# Patient Record
Sex: Male | Born: 1965 | Race: Black or African American | Hispanic: No | Marital: Married | Smoking: Current some day smoker
Health system: Southern US, Community
[De-identification: ages and names within clinical notes are randomized; demographics above are authoritative.]

## PROBLEM LIST (undated history)

## (undated) DIAGNOSIS — E119 Type 2 diabetes mellitus without complications: Secondary | ICD-10-CM

## (undated) DIAGNOSIS — I10 Essential (primary) hypertension: Secondary | ICD-10-CM

## (undated) HISTORY — PX: LEG SURGERY: SHX1003

---

## 2019-04-22 ENCOUNTER — Other Ambulatory Visit: Payer: Self-pay | Admitting: *Deleted

## 2019-04-22 DIAGNOSIS — Z20822 Contact with and (suspected) exposure to covid-19: Secondary | ICD-10-CM

## 2019-04-22 NOTE — Progress Notes (Signed)
78

## 2020-02-20 ENCOUNTER — Encounter (HOSPITAL_COMMUNITY): Payer: Self-pay | Admitting: Emergency Medicine

## 2020-02-20 ENCOUNTER — Emergency Department (HOSPITAL_COMMUNITY)
Admission: EM | Admit: 2020-02-20 | Discharge: 2020-02-20 | Disposition: A | Discharge status: Home / Self Care | Payer: Self-pay | Attending: Emergency Medicine | Admitting: Emergency Medicine

## 2020-02-20 ENCOUNTER — Other Ambulatory Visit: Payer: Self-pay

## 2020-02-20 ENCOUNTER — Emergency Department (HOSPITAL_COMMUNITY): Payer: Self-pay

## 2020-02-20 DIAGNOSIS — I10 Essential (primary) hypertension: Secondary | ICD-10-CM | POA: Insufficient documentation

## 2020-02-20 DIAGNOSIS — X509XXA Other and unspecified overexertion or strenuous movements or postures, initial encounter: Secondary | ICD-10-CM | POA: Insufficient documentation

## 2020-02-20 DIAGNOSIS — Y990 Civilian activity done for income or pay: Secondary | ICD-10-CM | POA: Insufficient documentation

## 2020-02-20 DIAGNOSIS — Y9389 Activity, other specified: Secondary | ICD-10-CM | POA: Insufficient documentation

## 2020-02-20 DIAGNOSIS — Y929 Unspecified place or not applicable: Secondary | ICD-10-CM | POA: Insufficient documentation

## 2020-02-20 DIAGNOSIS — Z87891 Personal history of nicotine dependence: Secondary | ICD-10-CM | POA: Insufficient documentation

## 2020-02-20 DIAGNOSIS — E119 Type 2 diabetes mellitus without complications: Secondary | ICD-10-CM | POA: Insufficient documentation

## 2020-02-20 DIAGNOSIS — S46911A Strain of unspecified muscle, fascia and tendon at shoulder and upper arm level, right arm, initial encounter: Secondary | ICD-10-CM | POA: Insufficient documentation

## 2020-02-20 HISTORY — DX: Essential (primary) hypertension: I10

## 2020-02-20 HISTORY — DX: Type 2 diabetes mellitus without complications: E11.9

## 2020-02-20 NOTE — ED Triage Notes (Signed)
Pt presents with R elbow pain after hearing a pop while strapping material on a pallet at work today. Pain worse with movement.

## 2020-02-20 NOTE — ED Provider Notes (Signed)
[facility] EMERGENCY DEPARTMENT Provider Note   CSN: [id number] Arrival date & time: 02/20/20  1236     History Chief Complaint  Patient presents with  . Elbow Pain    Bobby Bates is a 54 y.o. male w PMHx HTN. DM, presenting to the ED with complaint of sudden onset of right elbow pain that began while at work today.  He states he was strapping material onto a pallet at work and pulling with his right arm when he felt a pop and pain in his right elbow.  Pain is worse with extension of his elbow.  No prior injuries to his elbow.  He took ibuprofen prior to arrival for symptoms.  No other injuries reported.  The history is provided by the patient.       Past Medical History:  Diagnosis Date  . Diabetes mellitus without complication (HCC)   . Hypertension     There are no problems to display for this patient.   History reviewed. No pertinent surgical history.     No family history on file.  Social History   Tobacco Use  . Smoking status: Former Smoker    Packs/day: 0.50    Years: 10.00    Pack years: 5.00    Types: Cigarettes    Quit date: 02/04/2020    Years since quitting: 0.0  . Smokeless tobacco: Never Used  Substance Use Topics  . Alcohol use: Never  . Drug use: Never    Home Medications Prior to Admission medications   Not on File    Allergies    Patient has no known allergies.  Review of Systems   Review of Systems  All other systems reviewed and are negative.   Physical Exam Updated Vital Signs BP 137/86 (BP Location: Right Arm)   Pulse 69   Temp 98.3 F (36.8 C) (Oral)   Resp 16   SpO2 95%   Physical Exam Vitals and nursing note reviewed.  Constitutional:      General: He is not in acute distress.    Appearance: He is well-developed.  HENT:     Head: Normocephalic and atraumatic.  Eyes:     Conjunctiva/sclera: Conjunctivae normal.  Cardiovascular:     Rate and Rhythm: Normal rate and regular rhythm.   Comments: Strong radial pulse. Pulmonary:     Effort: Pulmonary effort is normal.     Breath sounds: Normal breath sounds.  Musculoskeletal:     Comments: Right elbow without obvious swelling or deformity.  There is tenderness along the distal triceps at the insertion into the ulna/olecranon. No palpable bulge. Pt has strong active extension of the elbow against resistance. This does reproduce the pain. Passive flexion of the wrist also causes pain. No other tenderness noted to the elbow. No pain with supination/pronation of the wrist.  Neurological:     Mental Status: He is alert.     Comments: Normal sensation to right hand and digits.  Psychiatric:        Mood and Affect: Mood normal.        Behavior: Behavior normal.     ED Results / Procedures / Treatments   Labs (all labs ordered are listed, but only abnormal results are displayed) Labs Reviewed - No data to display  EKG None  Radiology DG Elbow Complete Right  Result Date: 02/20/2020 CLINICAL DATA:  Right elbow pain after injury today. EXAM: RIGHT ELBOW - COMPLETE 3+ VIEW COMPARISON:  None. FINDINGS: Minimal degenerative change  at the elbow joint. No acute fracture or dislocation. No significant joint effusion. Minimal spurring over the olecranon. IMPRESSION: No acute findings. Electronically Signed   By: Marin Olp M.D.   On: 02/20/2020 15:21    Procedures Procedures (including critical care time)  Medications Ordered in ED Medications - No data to display  ED Course  I have reviewed the triage vital signs and the nursing notes.  Pertinent labs & imaging results that were available during my care of the patient were reviewed by me and considered in my medical decision making (see chart for details).    MDM Rules/Calculators/A&P                      Pt with likely triceps strain after injury that occurred today while at work.  He has strong extension of the elbow against resistance, no palpable bulge.  Triceps  appears to be intact.  X-ray is negative.  Neurovascularly intact.  Elbow wrapped with Ace wrap for compression and support.  Discussed symptomatic management, RICE therapy, OTC medications for pain, and outpatient follow-up as needed.  Work note provided.  Patient is safe for discharge.  Discussed results, findings, treatment and follow up. Patient advised of return precautions. Patient verbalized understanding and agreed with plan.  Final Clinical Impression(s) / ED Diagnoses Final diagnoses:  Strain of right elbow, initial encounter    Rx / DC Orders ED Discharge Orders    None       Saman Umstead, [place] N, PA-C 02/20/20 1629    Maudie Flakes, MD 02/26/20 [phone]

## 2020-02-20 NOTE — Discharge Instructions (Signed)
Please read instructions below. Apply ice to your elbow for 20 minutes at a time. You can take over-the-counter medications as directed for pain. Schedule an appointment with primary care. Return to the ER for new or concerning symptoms.

## 2020-02-20 NOTE — ED Notes (Signed)
Patient Alert and oriented to baseline. Stable and ambulatory to baseline. Patient verbalized understanding of the discharge instructions.  Patient belongings were taken by the patient.   

## 2020-09-02 ENCOUNTER — Ambulatory Visit (HOSPITAL_COMMUNITY)
Admission: EM | Admit: 2020-09-02 | Discharge: 2020-09-02 | Disposition: A | Discharge status: Home / Self Care | Payer: Self-pay

## 2020-09-02 ENCOUNTER — Other Ambulatory Visit: Payer: Self-pay

## 2020-09-02 ENCOUNTER — Encounter (HOSPITAL_COMMUNITY): Payer: Self-pay

## 2020-09-02 ENCOUNTER — Ambulatory Visit (INDEPENDENT_AMBULATORY_CARE_PROVIDER_SITE_OTHER): Payer: Self-pay

## 2020-09-02 DIAGNOSIS — M545 Low back pain, unspecified: Secondary | ICD-10-CM

## 2020-09-02 DIAGNOSIS — W19XXXA Unspecified fall, initial encounter: Secondary | ICD-10-CM

## 2020-09-02 MED ORDER — CYCLOBENZAPRINE HCL 10 MG PO TABS: 10.0000 mg | ORAL_TABLET | Freq: Three times a day (TID) | ORAL | 0 refills | Status: DC | PRN

## 2020-09-02 MED ORDER — NAPROXEN 500 MG PO TABS: 500.0000 mg | ORAL_TABLET | Freq: Two times a day (BID) | ORAL | 0 refills | Status: DC
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
## 2020-09-28 ENCOUNTER — Encounter (HOSPITAL_COMMUNITY): Payer: Self-pay | Admitting: Emergency Medicine

## 2020-09-28 ENCOUNTER — Ambulatory Visit (HOSPITAL_COMMUNITY)
Admission: EM | Admit: 2020-09-28 | Discharge: 2020-09-28 | Disposition: A | Discharge status: Home / Self Care | Payer: Self-pay | Attending: Urgent Care | Admitting: Urgent Care

## 2020-09-28 DIAGNOSIS — R42 Dizziness and giddiness: Secondary | ICD-10-CM

## 2020-09-28 DIAGNOSIS — E1165 Type 2 diabetes mellitus with hyperglycemia: Secondary | ICD-10-CM

## 2020-09-28 DIAGNOSIS — N481 Balanitis: Secondary | ICD-10-CM

## 2020-09-28 DIAGNOSIS — H538 Other visual disturbances: Secondary | ICD-10-CM

## 2020-09-28 DIAGNOSIS — R110 Nausea: Secondary | ICD-10-CM

## 2020-09-28 DIAGNOSIS — I950 Idiopathic hypotension: Secondary | ICD-10-CM

## 2020-09-28 DIAGNOSIS — Z760 Encounter for issue of repeat prescription: Secondary | ICD-10-CM

## 2020-09-28 DIAGNOSIS — I959 Hypotension, unspecified: Secondary | ICD-10-CM

## 2020-09-28 LAB — POCT URINALYSIS DIPSTICK, ED / UC
Bilirubin Urine: NEGATIVE
Glucose, UA: >=1000 mg/dL — AB
Hgb urine dipstick: NEGATIVE
Ketones, ur: NEGATIVE mg/dL
Leukocytes,Ua: NEGATIVE
Nitrite: NEGATIVE
Protein, ur: NEGATIVE mg/dL
Specific Gravity, Urine: 1.01 (ref 1.005–1.030)
Urobilinogen, UA: 0.2 mg/dL (ref 0.0–1.0)
pH: 5 (ref 5.0–8.0)

## 2020-09-28 LAB — CBG MONITORING, ED: Glucose-Capillary: 489 mg/dL — ABNORMAL HIGH (ref 70–99)

## 2020-09-28 MED ORDER — ATORVASTATIN CALCIUM 20 MG PO TABS: 20.0000 mg | ORAL_TABLET | Freq: Every day | ORAL | 0 refills | Status: AC

## 2020-09-28 MED ORDER — METFORMIN HCL 1000 MG PO TABS: 1000.0000 mg | ORAL_TABLET | Freq: Two times a day (BID) | ORAL | 0 refills | Status: DC

## 2020-09-28 MED ORDER — NYSTATIN-TRIAMCINOLONE 100000-0.1 UNIT/GM-% EX CREA: TOPICAL_CREAM | CUTANEOUS | 0 refills | Status: DC

## 2020-09-28 NOTE — ED Triage Notes (Addendum)
Pt presents for medication refill. States recently moved to [facility] and does not have PCP yet. States vision is very blurry. States has been with out medications for 1 month.   Marlowe [place], PA notified about pts CBG

## 2020-09-28 NOTE — ED Provider Notes (Signed)
[facility]  MRN: [id number] DOB: Apr 21, 1966  Subjective:   Bobby Bates is a 55 y.o. male presenting for medication refill. Patient states he has been out of his medication for a month. Was on Metformin. Has actually been using his wife's insulin even though he is not prescribed this. Diet is not compliant. He does not have a PCP as he just moved here from [place]. Has had intermittent blurred vision, stinging penile pain and inability to retract the foreskin. Denies fever, headache, confusion, chest pain, shortness of breath, cough, sore throat, belly pain, hematuria, dysuria, urinary frequency. Regarding his blood pressure, patient takes lisinopril 20 mg once daily. He has been compliant with this.  No current facility-administered medications for this encounter.  Current Outpatient Medications:  .  aspirin EC 81 MG tablet, Take 81 mg by mouth daily. Swallow whole., Disp: , Rfl:  .  atorvastatin (LIPITOR) 20 MG tablet, Take 20 mg by mouth daily., Disp: , Rfl:  .  cyclobenzaprine (FLEXERIL) 10 MG tablet, Take 1 tablet (10 mg total) by mouth 3 (three) times daily as needed for muscle spasms. DO NOT DRINK ALCOHOL OR DRIVE WHILE TAKING THIS MEDICATION, Disp: 30 tablet, Rfl: 0 .  lisinopril (ZESTRIL) 20 MG tablet, Take 20 mg by mouth daily. Pt unsure of dosage strength, Disp: , Rfl:  .  metFORMIN (GLUCOPHAGE) 500 MG tablet, Take 500 mg by mouth 2 (two) times daily with a meal., Disp: , Rfl:  .  naproxen (NAPROSYN) 500 MG tablet, Take 1 tablet (500 mg total) by mouth 2 (two) times daily., Disp: 30 tablet, Rfl: 0   No Known Allergies  Past Medical History:  Diagnosis Date  . Diabetes mellitus without complication (HCC)   . Hypertension      Past Surgical History:  Procedure Laterality Date  . LEG SURGERY Right     Family History  Problem Relation Age of Onset  . Kidney failure Father     Social History   Tobacco Use  . Smoking status: Former Smoker     Packs/day: 0.50    Years: 10.00    Pack years: 5.00    Types: Cigarettes    Quit date: 02/04/2020    Years since quitting: 0.6  . Smokeless tobacco: Never Used  Substance Use Topics  . Alcohol use: Never  . Drug use: Never    ROS   Objective:   Vitals: BP (!) 95/42 (BP Location: Left Arm)   Pulse 96   Temp 98 F (36.7 C) (Oral)   Resp 17   SpO2 97%   Physical Exam Constitutional:      General: He is not in acute distress.    Appearance: Normal appearance. He is well-developed. He is not ill-appearing, toxic-appearing or diaphoretic.  HENT:     Head: Normocephalic and atraumatic.     Right Ear: External ear normal.     Left Ear: External ear normal.     Nose: Nose normal.     Mouth/Throat:     Mouth: Mucous membranes are moist.     Pharynx: Oropharynx is clear.  Eyes:     General: No scleral icterus.    Extraocular Movements: Extraocular movements intact.     Pupils: Pupils are equal, round, and reactive to light.  Cardiovascular:     Rate and Rhythm: Normal rate and regular rhythm.     Heart sounds: Normal heart sounds. No murmur heard. No friction rub. No gallop.   Pulmonary:  Effort: Pulmonary effort is normal. No respiratory distress.     Breath sounds: Normal breath sounds. No stridor. No wheezing, rhonchi or rales.  Genitourinary:    Penis: Uncircumcised. Paraphimosis present.   Neurological:     Mental Status: He is alert and oriented to person, place, and time.     Cranial Nerves: No cranial nerve deficit.     Motor: No weakness.     Coordination: Coordination normal.     Gait: Gait normal.     Deep Tendon Reflexes: Reflexes normal.  Psychiatric:        Mood and Affect: Mood normal.        Behavior: Behavior normal.        Thought Content: Thought content normal.     Results for orders placed or performed during the hospital encounter of 09/28/20 (from the past 24 hour(s))  POC CBG monitoring     Status: Abnormal   Collection Time: 09/28/20  10:08 AM  Result Value Ref Range   Glucose-Capillary 489 (H) 70 - 99 mg/dL  POC Urinalysis dipstick     Status: Abnormal   Collection Time: 09/28/20 10:34 AM  Result Value Ref Range   Glucose, UA >=1000 (A) NEGATIVE mg/dL   Bilirubin Urine NEGATIVE NEGATIVE   Ketones, ur NEGATIVE NEGATIVE mg/dL   Specific Gravity, Urine 1.010 1.005 - 1.030   Hgb urine dipstick NEGATIVE NEGATIVE   pH 5.0 5.0 - 8.0   Protein, ur NEGATIVE NEGATIVE mg/dL   Urobilinogen, UA 0.2 0.0 - 1.0 mg/dL   Nitrite NEGATIVE NEGATIVE   Leukocytes,Ua NEGATIVE NEGATIVE    Assessment and Plan :   PDMP not reviewed this encounter.  1. Encounter for medication refill   2. Hypotension, unspecified hypotension type   3. Uncontrolled type 2 diabetes mellitus with hyperglycemia (HCC)   4. Queasiness   5. Dizziness   6. Blurred vision   7. Balanitis     Emphasized need for significant dietary modifications for better control of his blood sugar. Refilled Metformin and gave patient dosing instructions. Recommended he start Mycolog for balanitis. Take half lisinopril dose as I suspect he is experiencing hypotension from his lisinopril. Recommend he establish care with a new PCP through [facility]. Counseled patient on potential for adverse effects with medications prescribed/recommended today, ER and return-to-clinic precautions discussed, patient verbalized understanding.    Wallis Bamberg, PA-C 09/28/20 1058

## 2020-09-28 NOTE — Discharge Instructions (Addendum)
Metformin Dosing (to be taken with food) Week 1: take 1/2 tablet twice a day. Week 2: take 1 tablet in the morning, 1/2 tablet at night. Week 3: take 1 tablet twice a day.  Start taking only half tablet of lisinopril daily for your blood pressure.  For diabetes or elevated blood sugar, please make sure you are limiting and avoiding starchy, carbohydrate foods like pasta, breads, sweet breads, pastry, rice, potatoes, desserts. These foods can elevate your blood sugar. Also, limit and avoid drinks that contain a lot of sugar such as sodas, sweet teas, fruit juices.  Drinking plain water will be much more helpful, try 64 ounces of water daily.  It is okay to flavor your water naturally by cutting cucumber, lemon, mint or lime, placing it in a picture with water and drinking it over a period of 24-48 hours as long as it remains refrigerated.  For elevated blood pressure, make sure you are monitoring salt in your diet.  Do not eat restaurant foods and limit processed foods at home. I highly recommend you prepare and cook your own foods at home.  Processed foods include things like frozen meals, pre-seasoned meats and dinners, deli meats, canned foods as these foods contain a high amount of sodium/salt.  Make sure you are paying attention to sodium labels on foods you buy at the grocery store. Buy your spices separately such as garlic powder, onion powder, cumin, cayenne, parsley flakes so that you can avoid seasonings that contain salt. However, salt-free seasonings are available and can be used, an example is Mrs. Dash and includes a lot of different mixtures that do not contain salt.  Lastly, when cooking using oils that are healthier for you is important. This includes olive oil, avocado oil, canola oil. We have discussed a lot of foods to avoid but below is a list of foods that can be very healthy to use in your diet whether it is for diabetes, cholesterol, high blood pressure, or in general healthy  eating.  Salads - kale, spinach, cabbage, spring mix, arugula Fruits - avocadoes, berries (blueberries, raspberries, blackberries), apples, oranges, pomegranate, grapefruit, kiwi Vegetables - asparagus, cauliflower, broccoli, green beans, brussel sprouts, bell peppers, beets; stay away from or limit starchy vegetables like potatoes, carrots, peas Other general foods - kidney beans, egg whites, almonds, walnuts, sunflower seeds, pumpkin seeds, fat free yogurt, almond milk, flax seeds, quinoa, oats  Meat - It is better to eat lean meats and limit your red meat including pork to once a week.  Wild caught fish, chicken breast are good options as they tend to be leaner sources of good protein. Still be mindful of the sodium labels for the meats you buy.  DO NOT EAT ANY FOODS ON THIS LIST THAT YOU ARE ALLERGIC TO. For more specific needs, I highly recommend consulting a dietician or nutritionist but this can definitely be a good starting point.

## 2020-10-12 ENCOUNTER — Emergency Department (HOSPITAL_COMMUNITY)
Admission: EM | Admit: 2020-10-12 | Discharge: 2020-10-12 | Disposition: A | Discharge status: Home / Self Care | Payer: Medicaid Other | Attending: Emergency Medicine | Admitting: Emergency Medicine

## 2020-10-12 ENCOUNTER — Other Ambulatory Visit: Payer: Self-pay

## 2020-10-12 ENCOUNTER — Encounter (HOSPITAL_COMMUNITY): Payer: Self-pay | Admitting: Emergency Medicine

## 2020-10-12 DIAGNOSIS — Z5321 Procedure and treatment not carried out due to patient leaving prior to being seen by health care provider: Secondary | ICD-10-CM | POA: Insufficient documentation

## 2020-10-12 DIAGNOSIS — R369 Urethral discharge, unspecified: Secondary | ICD-10-CM | POA: Insufficient documentation

## 2020-10-12 NOTE — ED Triage Notes (Signed)
Patient is complaining of a penile discharge that started about 4 days ago.

## 2020-10-12 NOTE — ED Notes (Signed)
Pt was seen getting into transportation and leaving by [facility].

## 2020-10-27 ENCOUNTER — Inpatient Hospital Stay (HOSPITAL_COMMUNITY)
Admission: EM | Admit: 2020-10-27 | Discharge: 2020-10-28 | DRG: 638 | Disposition: A | Discharge status: Home / Self Care | Payer: Medicaid Other | Attending: Internal Medicine | Admitting: Internal Medicine

## 2020-10-27 ENCOUNTER — Other Ambulatory Visit: Payer: Self-pay

## 2020-10-27 ENCOUNTER — Encounter (HOSPITAL_COMMUNITY): Payer: Self-pay | Admitting: Emergency Medicine

## 2020-10-27 DIAGNOSIS — N481 Balanitis: Secondary | ICD-10-CM

## 2020-10-27 DIAGNOSIS — Z79899 Other long term (current) drug therapy: Secondary | ICD-10-CM

## 2020-10-27 DIAGNOSIS — N390 Urinary tract infection, site not specified: Secondary | ICD-10-CM | POA: Diagnosis present

## 2020-10-27 DIAGNOSIS — N179 Acute kidney failure, unspecified: Secondary | ICD-10-CM | POA: Diagnosis present

## 2020-10-27 DIAGNOSIS — E860 Dehydration: Secondary | ICD-10-CM

## 2020-10-27 DIAGNOSIS — E861 Hypovolemia: Secondary | ICD-10-CM | POA: Diagnosis present

## 2020-10-27 DIAGNOSIS — Z7984 Long term (current) use of oral hypoglycemic drugs: Secondary | ICD-10-CM

## 2020-10-27 DIAGNOSIS — E871 Hypo-osmolality and hyponatremia: Secondary | ICD-10-CM | POA: Diagnosis present

## 2020-10-27 DIAGNOSIS — Z597 Insufficient social insurance and welfare support: Secondary | ICD-10-CM

## 2020-10-27 DIAGNOSIS — E1129 Type 2 diabetes mellitus with other diabetic kidney complication: Secondary | ICD-10-CM | POA: Diagnosis present

## 2020-10-27 DIAGNOSIS — Z841 Family history of disorders of kidney and ureter: Secondary | ICD-10-CM

## 2020-10-27 DIAGNOSIS — Z20822 Contact with and (suspected) exposure to covid-19: Secondary | ICD-10-CM | POA: Diagnosis present

## 2020-10-27 DIAGNOSIS — R739 Hyperglycemia, unspecified: Secondary | ICD-10-CM

## 2020-10-27 DIAGNOSIS — I10 Essential (primary) hypertension: Secondary | ICD-10-CM

## 2020-10-27 DIAGNOSIS — Z87891 Personal history of nicotine dependence: Secondary | ICD-10-CM

## 2020-10-27 DIAGNOSIS — E1165 Type 2 diabetes mellitus with hyperglycemia: Principal | ICD-10-CM | POA: Diagnosis present

## 2020-10-27 DIAGNOSIS — Z7982 Long term (current) use of aspirin: Secondary | ICD-10-CM

## 2020-10-27 LAB — HEPATIC FUNCTION PANEL
ALT: 33 U/L (ref 0–44)
AST: 22 U/L (ref 15–41)
Albumin: 4.1 g/dL (ref 3.5–5.0)
Alkaline Phosphatase: 83 U/L (ref 38–126)
Bilirubin, Direct: 0.2 mg/dL (ref 0.0–0.2)
Indirect Bilirubin: 1.2 mg/dL — ABNORMAL HIGH (ref 0.3–0.9)
Total Bilirubin: 1.4 mg/dL — ABNORMAL HIGH (ref 0.3–1.2)
Total Protein: 7.9 g/dL (ref 6.5–8.1)

## 2020-10-27 LAB — URINALYSIS, ROUTINE W REFLEX MICROSCOPIC
Bilirubin Urine: NEGATIVE
Glucose, UA: >=500 mg/dL — AB
Hgb urine dipstick: NEGATIVE
Ketones, ur: NEGATIVE mg/dL
Nitrite: NEGATIVE
Protein, ur: NEGATIVE mg/dL
Specific Gravity, Urine: 1.02 (ref 1.005–1.030)
pH: 5 (ref 5.0–8.0)

## 2020-10-27 LAB — BASIC METABOLIC PANEL
Anion gap: 11 (ref 5–15)
BUN: 39 mg/dL — ABNORMAL HIGH (ref 6–20)
CO2: 23 mmol/L (ref 22–32)
Calcium: 9.4 mg/dL (ref 8.9–10.3)
Chloride: 93 mmol/L — ABNORMAL LOW (ref 98–111)
Creatinine, Ser: 2.17 mg/dL — ABNORMAL HIGH (ref 0.61–1.24)
GFR, Estimated: 35 mL/min — ABNORMAL LOW (ref >60)
Glucose, Bld: 558 mg/dL (ref 70–99)
Potassium: 4.2 mmol/L (ref 3.5–5.1)
Sodium: 127 mmol/L — ABNORMAL LOW (ref 135–145)

## 2020-10-27 LAB — URINALYSIS, MICROSCOPIC (REFLEX): WBC, UA: >50 WBC/hpf (ref 0–5)

## 2020-10-27 LAB — CBC
HCT: 39.5 % (ref 39.0–52.0)
Hemoglobin: 13 g/dL (ref 13.0–17.0)
MCH: 31.2 pg (ref 26.0–34.0)
MCHC: 32.9 g/dL (ref 30.0–36.0)
MCV: 94.7 fL (ref 80.0–100.0)
Platelets: 209 10*3/uL (ref 150–400)
RBC: 4.17 MIL/uL — ABNORMAL LOW (ref 4.22–5.81)
RDW: 11.5 % (ref 11.5–15.5)
WBC: 9.3 10*3/uL (ref 4.0–10.5)
nRBC: 0 % (ref 0.0–0.2)

## 2020-10-27 LAB — LIPASE, BLOOD: Lipase: 27 U/L (ref 11–51)

## 2020-10-27 LAB — CBG MONITORING, ED
Glucose-Capillary: 334 mg/dL — ABNORMAL HIGH (ref 70–99)
Glucose-Capillary: 287 mg/dL — ABNORMAL HIGH (ref 70–99)

## 2020-10-27 LAB — HEMOGLOBIN A1C
Hgb A1c MFr Bld: 12 % — ABNORMAL HIGH (ref 4.8–5.6)
Mean Plasma Glucose: 297.7 mg/dL

## 2020-10-27 LAB — HIV ANTIBODY (ROUTINE TESTING W REFLEX): HIV Screen 4th Generation wRfx: NONREACTIVE

## 2020-10-27 LAB — SARS CORONAVIRUS 2 (TAT 6-24 HRS): SARS Coronavirus 2: NEGATIVE

## 2020-10-27 MED ORDER — SODIUM CHLORIDE 0.9 % IV SOLN: INTRAVENOUS | Status: AC

## 2020-10-27 MED ORDER — ACETAMINOPHEN 650 MG RE SUPP: 650.0000 mg | Freq: Four times a day (QID) | RECTAL | Status: DC | PRN

## 2020-10-27 MED ORDER — SODIUM CHLORIDE 0.9 % IV SOLN
1.0000 g | Freq: Once | INTRAVENOUS | Status: DC
  Administered 2020-10-27: 1 g via INTRAVENOUS
  Filled 2020-10-27: qty 10

## 2020-10-27 MED ORDER — DEXTROSE 50 % IV SOLN: 0.0000 mL | INTRAVENOUS | Status: DC | PRN

## 2020-10-27 MED ORDER — INSULIN ASPART 100 UNIT/ML ~~LOC~~ SOLN
0.0000 [IU] | Freq: Every day | SUBCUTANEOUS | Status: DC
  Administered 2020-10-27: 3 [IU] via SUBCUTANEOUS

## 2020-10-27 MED ORDER — SODIUM CHLORIDE 0.9 % IV SOLN: INTRAVENOUS | Status: DC

## 2020-10-27 MED ORDER — INSULIN ASPART 100 UNIT/ML ~~LOC~~ SOLN
5.0000 [IU] | Freq: Three times a day (TID) | SUBCUTANEOUS | Status: DC
  Administered 2020-10-28 (×2): 5 [IU] via SUBCUTANEOUS

## 2020-10-27 MED ORDER — INSULIN REGULAR(HUMAN) IN NACL 100-0.9 UT/100ML-% IV SOLN: INTRAVENOUS | Status: DC

## 2020-10-27 MED ORDER — ACETAMINOPHEN 325 MG PO TABS: 650.0000 mg | ORAL_TABLET | Freq: Four times a day (QID) | ORAL | Status: DC | PRN

## 2020-10-27 MED ORDER — NYSTATIN-TRIAMCINOLONE 100000-0.1 UNIT/GM-% EX CREA
TOPICAL_CREAM | Freq: Two times a day (BID) | CUTANEOUS | Status: DC
  Filled 2020-10-27: qty 15

## 2020-10-27 MED ORDER — DEXTROSE IN LACTATED RINGERS 5 % IV SOLN: INTRAVENOUS | Status: DC

## 2020-10-27 MED ORDER — SODIUM CHLORIDE 0.9 % IV SOLN: 1.0000 g | INTRAVENOUS | Status: DC

## 2020-10-27 MED ORDER — SODIUM CHLORIDE 0.9 % IV BOLUS
1000.0000 mL | Freq: Once | INTRAVENOUS | Status: AC
  Administered 2020-10-27: 1000 mL via INTRAVENOUS

## 2020-10-27 MED ORDER — HYDRALAZINE HCL 20 MG/ML IJ SOLN: 5.0000 mg | INTRAMUSCULAR | Status: DC | PRN

## 2020-10-27 MED ORDER — INSULIN ASPART 100 UNIT/ML ~~LOC~~ SOLN
0.0000 [IU] | Freq: Three times a day (TID) | SUBCUTANEOUS | Status: DC
  Administered 2020-10-28 (×2): 2 [IU] via SUBCUTANEOUS

## 2020-10-27 MED ORDER — INSULIN GLARGINE 100 UNIT/ML ~~LOC~~ SOLN
20.0000 [IU] | Freq: Every day | SUBCUTANEOUS | Status: DC
  Administered 2020-10-27: 20 [IU] via SUBCUTANEOUS
  Filled 2020-10-27 (×2): qty 0.2

## 2020-10-27 MED ORDER — ENOXAPARIN SODIUM 40 MG/0.4ML ~~LOC~~ SOLN
40.0000 mg | SUBCUTANEOUS | Status: DC
  Administered 2020-10-27: 40 mg via SUBCUTANEOUS
  Filled 2020-10-27: qty 0.4

## 2020-10-27 NOTE — H&P (Signed)
History and Physical    Bobby Bates PPI:[id number] DOB: 08/15/1966 DOA: 10/27/2020  PCP: Patient, No Pcp Per  Patient coming from: Home  Chief Complaint: Weakness  HPI: Bobby Bates is a 55 y.o. male with medical history significant of DM2, HTN who presents from work with increased weakness. Reports weakness several weeks prior to visit, attributed to working 8hrs. Symptoms later progressed to dizziness several days prior to this visit. On the morning of visit while getting ready to go to work, pt reported more dizziness with blurred vision. Pt subsequently presented to ED for work up.   Of note, pt reports compliance with meds. [place] labs available in this facility. Denies chest pain or sob. Also reports last a1c from 59yrs ago noted to be 5.1  ED Course: In the ED, pt was found to have glucose of 558 with Cr of 2.17. Repeat glucose was noted to be in the mid-300's. Bicarb noted to be 23 with normal anion gap. Pt was given 5 units IV insulin in ED and started on IVF hydration. Pt was also found to have UA suggestive of UTI. Pt was started on empiric rocephin. Hospitalist consulted for consideration for admission  Review of Systems:  Review of Systems  Constitutional: Positive for malaise/fatigue. Negative for chills and fever.  HENT: Negative for congestion, ear discharge and ear pain.   Eyes: Positive for blurred vision. Negative for double vision, pain and discharge.  Respiratory: Negative for hemoptysis, sputum production and shortness of breath.   Cardiovascular: Negative for palpitations, orthopnea and claudication.  Gastrointestinal: Negative for abdominal pain, constipation and vomiting.  Genitourinary: Negative for frequency and urgency.  Musculoskeletal: Negative for back pain, falls and neck pain.  Neurological: Negative for tremors, sensory change, seizures and loss of consciousness.  Psychiatric/Behavioral: Negative for hallucinations and memory  loss. The patient is not nervous/anxious.     Past Medical History:  Diagnosis Date  . Diabetes mellitus without complication (HCC)   . Hypertension     Past Surgical History:  Procedure Laterality Date  . LEG SURGERY Right      reports that he quit smoking about 8 months ago. His smoking use included cigarettes. He has a 5.00 pack-year smoking history. He has never used smokeless tobacco. He reports that he does not drink alcohol and does not use drugs.  No Known Allergies  Family History  Problem Relation Age of Onset  . Kidney failure Father     Prior to Admission medications   Medication Sig Start Date End Date Taking? Authorizing Provider  aspirin EC 81 MG tablet Take 81 mg by mouth daily. Swallow whole.    [provider]  atorvastatin (LIPITOR) 20 MG tablet Take 1 tablet (20 mg total) by mouth daily. 09/28/20   Wallis Bamberg, PA-C  cyclobenzaprine (FLEXERIL) 10 MG tablet Take 1 tablet (10 mg total) by mouth 3 (three) times daily as needed for muscle spasms. DO NOT DRINK ALCOHOL OR DRIVE WHILE TAKING THIS MEDICATION 09/02/20   Particia Nearing, PA-C  lisinopril (ZESTRIL) 20 MG tablet Take 20 mg by mouth daily. Pt unsure of dosage strength    [provider]  metFORMIN (GLUCOPHAGE) 1000 MG tablet Take 1 tablet (1,000 mg total) by mouth 2 (two) times daily with a meal. 09/28/20   Wallis Bamberg, PA-C  naproxen (NAPROSYN) 500 MG tablet Take 1 tablet (500 mg total) by mouth 2 (two) times daily. 09/02/20   Particia Nearing, PA-C  nystatin-triamcinolone (MYCOLOG II) cream Apply to affected area twice daily. 09/28/20   Wallis Bamberg, PA-C    Physical Exam: Vitals:   10/27/20 1219 10/27/20 1545 10/27/20 1615 10/27/20 1715  BP: 100/60 105/69 106/72 123/77  Pulse: 76 (!) 58 62 60  Resp: 18 14 15 15   Temp: 98.2 F (36.8 C)     TempSrc: Oral     SpO2: 98% 96% 98% 97%  Weight: 113.4 kg     Height: 6\' 1"  (1.854 m)        Vitals:   10/27/20 1219 10/27/20 1545  10/27/20 1615 10/27/20 1715  BP: 100/60 105/69 106/72 123/77  Pulse: 76 (!) 58 62 60  Resp: 18 14 15 15   Temp: 98.2 F (36.8 C)     TempSrc: Oral     SpO2: 98% 96% 98% 97%  Weight: 113.4 kg     Height: 6\' 1"  (1.854 m)      General exam: Awake, laying in bed, in nad Respiratory system: Normal respiratory effort, no audible wheezing Cardiovascular system: regular rate, s1, s2 Gastrointestinal system: Soft, nondistended, positive BS Central nervous system: CN2-12 grossly intact, strength intact Extremities: Perfused, no clubbing Skin: Normal skin turgor, no notable skin lesions seen Psychiatry: Mood normal // no visual hallucinations    Labs on Admission: I have personally reviewed following labs and imaging studies  CBC: Recent Labs  Lab 10/27/20 1242  WBC 9.3  HGB 13.0  HCT 39.5  MCV 94.7  PLT 209   Basic Metabolic Panel: Recent Labs  Lab 10/27/20 1242  NA 127*  K 4.2  CL 93*  CO2 23  GLUCOSE 558*  BUN 39*  CREATININE 2.17*  CALCIUM 9.4   GFR: Estimated Creatinine Clearance: 50.2 mL/min (A) (by C-G formula based on SCr of 2.17 mg/dL (H)). Liver Function Tests: No results for input(s): AST, ALT, ALKPHOS, BILITOT, PROT, ALBUMIN in the last 168 hours. No results for input(s): LIPASE, AMYLASE in the last 168 hours. No results for input(s): AMMONIA in the last 168 hours. Coagulation Profile: No results for input(s): INR, PROTIME in the last 168 hours. Cardiac Enzymes: No results for input(s): CKTOTAL, CKMB, CKMBINDEX, TROPONINI in the last 168 hours. BNP (last 3 results) No results for input(s): PROBNP in the last 8760 hours. HbA1C: No results for input(s): HGBA1C in the last 72 hours. CBG: Recent Labs  Lab 10/27/20 1716  GLUCAP 334*   Lipid Profile: No results for input(s): CHOL, HDL, LDLCALC, TRIG, CHOLHDL, LDLDIRECT in the last 72 hours. Thyroid Function Tests: No results for input(s): TSH, T4TOTAL, FREET4, T3FREE, THYROIDAB in the last 72  hours. Anemia Panel: No results for input(s): VITAMINB12, FOLATE, FERRITIN, TIBC, IRON, RETICCTPCT in the last 72 hours. Urine analysis:    Component Value Date/Time   COLORURINE YELLOW 10/27/2020 1326   APPEARANCEUR CLEAR 10/27/2020 1326   LABSPEC 1.020 10/27/2020 1326   PHURINE 5.0 10/27/2020 1326   GLUCOSEU >=500 (A) 10/27/2020 1326   HGBUR NEGATIVE 10/27/2020 1326   BILIRUBINUR NEGATIVE 10/27/2020 1326   KETONESUR NEGATIVE 10/27/2020 1326   PROTEINUR NEGATIVE 10/27/2020 1326   UROBILINOGEN 0.2 09/28/2020 1034   NITRITE NEGATIVE 10/27/2020 1326   LEUKOCYTESUR SMALL (A) 10/27/2020 1326   Sepsis Labs: !!!!!!!!!!!!!!!!!!!!!!!!!!!!!!!!!!!!!!!!!!!! @LABRCNTIP (procalcitonin:4,lacticidven:4) )No results found for this or any previous visit (from the past 240 hour(s)).   Radiological Exams on Admission: No results found.  EKG: Independently reviewed. 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 DM2 1. Poorly controlled with presenting glucose in excess of 500's 2. On metformin prior to admit 3. Pt has since received 5 untis IV insulin in ED 4. Will check a1c 5. Will continue on SSI coverage 6. Will consult diabetic coordinator 7. Will cont pt on 20 units lantus with 5 units meal coverage. Titrate insulin accordingly 8. Will check lipid panel 2. HTN 1. BP stable and controlled at this time 2. On lisinopril prior to admit, will hold given below ARF 3. Cont to monitor for now 3. UTI 1. Presenting UA suggestive of UTI 2. Urine cx pending 3. Continue on empiric rocephin 4. Dehydration 1. Dry membranes on admit, likely secondary to uncontrolled glucose 2. Cont IVF hydration as tolerated 3. Repeat bmet in AM 5. Hyponatremia 1. Cont IVF hydration 2. Repeat bmet in AM 6. ARF 1. Presenting Cr 2.17, unknown baseline 2. Hold  home ACEI 3. Cont on IVF as tolerated 4. Repeat bmet in AM  DVT prophylaxis: Lovenox subq  Code Status: Full Family Communication: pt in room, family not at bedside  Disposition Plan:   Consults called:  Admission status: Inpatient as pt is clinically dehydrated with ARF and will need greater than 2 midnight stay for IVF hydration   Rickey Barbara MD [facility] Pager On Amion  If 7PM-7AM, please contact night-coverage  10/27/2020, 5:58 PM

## 2020-10-27 NOTE — ED Triage Notes (Signed)
Pt arrives to ED with concerns over increased weakness and fatigue over the last month. Pt states that he is unable to finish hs work weeks or 12hr shift due to fatigue. States occasional dizziness. Works at [employer].

## 2020-10-27 NOTE — ED Provider Notes (Signed)
[facility] EMERGENCY DEPARTMENT Provider Note   CSN: [id number] Arrival date & time: 10/27/20  1158     History Chief Complaint  Patient presents with  . Weakness    Bobby Bates is a 55 y.o. male.  Bobby Bates is a 55 y.o. male with hx of HTN, and diabetes, who presents who presented to ED today for weakness. States that for the past month he has had muscle weakness, dizzy spells and headaches. States that he works 12 hour shifts and has been leaving after 8 hours due to dizziness and nausea. States that yesterday he began having nausea, left work and vomited once. He woke up this morning with lightheadedness and presented to ED. BG today was >500. States that he checks his BG at home and was under control around 130s until December when they have been uncontrollable. In addition to hyperglycemia, he has been treating balanitis since December with anti-fungal cream without success. States that he is unable to completely retract foreskin. Also notes that vision has been intermittently blurry, and has had increasing nocturia. Of note, he moved from [place] two years ago and has not established primary. He states he has been using Metformin 500mg  BID as prescribed by previous provider. Denies abdominal pain, nausea. Denies CP or SOB. Denies burning on urination. Denies history of kidney problems.         Past Medical History:  Diagnosis Date  . Diabetes mellitus without complication (HCC)   . Hypertension     There are no problems to display for this patient.   Past Surgical History:  Procedure Laterality Date  . LEG SURGERY Right        Family History  Problem Relation Age of Onset  . Kidney failure Father     Social History   Tobacco Use  . Smoking status: Former Smoker    Packs/day: 0.50    Years: 10.00    Pack years: 5.00    Types: Cigarettes    Quit date: 02/04/2020    Years since quitting: 0.7  . Smokeless tobacco: Never Used  Substance  Use Topics  . Alcohol use: Never  . Drug use: Never    Home Medications Prior to Admission medications   Medication Sig Start Date End Date Taking? Authorizing Provider  aspirin EC 81 MG tablet Take 81 mg by mouth daily. Swallow whole.    [provider]  atorvastatin (LIPITOR) 20 MG tablet Take 1 tablet (20 mg total) by mouth daily. 09/28/20   11/26/20, PA-C  cyclobenzaprine (FLEXERIL) 10 MG tablet Take 1 tablet (10 mg total) by mouth 3 (three) times daily as needed for muscle spasms. DO NOT DRINK ALCOHOL OR DRIVE WHILE TAKING THIS MEDICATION 09/02/20   14/8/21, PA-C  lisinopril (ZESTRIL) 20 MG tablet Take 20 mg by mouth daily. Pt unsure of dosage strength    [provider]  metFORMIN (GLUCOPHAGE) 1000 MG tablet Take 1 tablet (1,000 mg total) by mouth 2 (two) times daily with a meal. 09/28/20   11/26/20, PA-C  naproxen (NAPROSYN) 500 MG tablet Take 1 tablet (500 mg total) by mouth 2 (two) times daily. 09/02/20   14/8/21, PA-C  nystatin-triamcinolone (MYCOLOG II) cream Apply to affected area twice daily. 09/28/20   11/26/20, PA-C    Allergies    Patient has no known allergies.  Review of Systems   Review of Systems  Constitutional: Positive for fatigue. Negative for chills and fever.  HENT:  Negative.   Eyes: Positive for visual disturbance.  Respiratory: Negative for cough and shortness of breath.   Cardiovascular: Negative for chest pain.  Gastrointestinal: Positive for nausea and vomiting. Negative for abdominal pain and diarrhea.  Genitourinary: Positive for dysuria, frequency and penile pain. Negative for scrotal swelling and testicular pain.  Musculoskeletal: Positive for myalgias. Negative for arthralgias.  Skin: Negative for color change and rash.  Neurological: Positive for dizziness and light-headedness. Negative for syncope, weakness and numbness.  All other systems reviewed and are negative.   Physical Exam Updated Vital  Signs BP 100/60   Pulse 76   Temp 98.2 F (36.8 C) (Oral)   Resp 18   Ht 6\' 1"  (1.854 m)   Wt 113.4 kg   SpO2 98%   BMI 32.98 kg/m   Physical Exam Vitals and nursing note reviewed. Exam conducted with a chaperone present.  Constitutional:      General: He is not in acute distress.    Appearance: Normal appearance. He is well-developed and well-nourished. He is not ill-appearing or diaphoretic.     Comments: Well-appearing and in no distress   HENT:     Head: Normocephalic and atraumatic.     Mouth/Throat:     Mouth: Oropharynx is clear and moist. Mucous membranes are dry.     Pharynx: Oropharynx is clear.  Eyes:     General:        Right eye: No discharge.        Left eye: No discharge.     Extraocular Movements: EOM normal.  Cardiovascular:     Rate and Rhythm: Normal rate and regular rhythm.     Pulses: Intact distal pulses.     Heart sounds: Normal heart sounds. No murmur heard. No friction rub. No gallop.   Pulmonary:     Effort: Pulmonary effort is normal. No respiratory distress.     Breath sounds: Normal breath sounds. No wheezing or rales.     Comments: Respirations equal and unlabored, patient able to speak in full sentences, lungs clear to auscultation bilaterally Abdominal:     General: Bowel sounds are normal. There is no distension.     Palpations: Abdomen is soft. There is no mass.     Tenderness: There is no abdominal tenderness. There is no guarding.     Comments: Abdomen soft, nondistended, nontender to palpation in all quadrants without guarding or peritoneal signs, no CVA tenderness  Genitourinary:    Penis: Phimosis, erythema, discharge and swelling present.      Comments: Uncircumcised penis with erythema and swelling over the foreskin, only able to retract minimally with purulent drainage noted, no erythema, swelling or tenderness over the testicles or scrotum. Musculoskeletal:        General: No deformity or edema.     Cervical back: Neck supple.      Right lower leg: No edema.     Left lower leg: No edema.  Skin:    General: Skin is warm and dry.     Capillary Refill: Capillary refill takes less than 2 seconds.  Neurological:     Mental Status: He is alert and oriented to person, place, and time.     Coordination: Coordination normal.     Comments: Speech is clear, able to follow commands Moves extremities without ataxia, coordination intact  Psychiatric:        Mood and Affect: Mood normal.        Behavior: Behavior normal.     ED  Results / Procedures / Treatments   Labs (all labs ordered are listed, but only abnormal results are displayed) Labs Reviewed  BASIC METABOLIC PANEL - Abnormal; Notable for the following components:      Result Value   Sodium 127 (*)    Chloride 93 (*)    Glucose, Bld 558 (*)    BUN 39 (*)    Creatinine, Ser 2.17 (*)    GFR, Estimated 35 (*)    All other components within normal limits  CBC - Abnormal; Notable for the following components:   RBC 4.17 (*)    All other components within normal limits  URINALYSIS, ROUTINE W REFLEX MICROSCOPIC - Abnormal; Notable for the following components:   Glucose, UA >=500 (*)    Leukocytes,Ua SMALL (*)    All other components within normal limits  URINALYSIS, MICROSCOPIC (REFLEX) - Abnormal; Notable for the following components:   Bacteria, UA MANY (*)    All other components within normal limits  CBG MONITORING, ED - Abnormal; Notable for the following components:   Glucose-Capillary 334 (*)    All other components within normal limits  URINE CULTURE  SARS CORONAVIRUS 2 (TAT 6-24 HRS)  LIPASE, BLOOD  HEPATIC FUNCTION PANEL    EKG EKG Interpretation  Date/Time:  Tuesday October 27 2020 17:12:18 EST Ventricular Rate:  63 PR Interval:    QRS Duration: 101 QT Interval:  383 QTC Calculation: 392 R Axis:   60 Text Interpretation: Sinus rhythm Confirmed by Kristine Royal [phone]) on 10/27/2020 5:15:14 PM   Radiology No results  found.  Procedures Procedures   Medications Ordered in ED Medications  cefTRIAXone (ROCEPHIN) 1 g in sodium chloride 0.9 % 100 mL IVPB (has no administration in time range)  nystatin-triamcinolone ointment (MYCOLOG) (has no administration in time range)  dextrose 50 % solution 0-50 mL (has no administration in time range)  0.9 %  sodium chloride infusion (has no administration in time range)  sodium chloride 0.9 % bolus 1,000 mL (1,000 mLs Intravenous New Bag/Given 10/27/20 1725)    ED Course  I have reviewed the triage vital signs and the nursing notes.  Pertinent labs & imaging results that were available during my care of the patient were reviewed by me and considered in my medical decision making (see chart for details).    MDM Rules/Calculators/A&P                         55 year old male with history of diabetes with significant hyperglycemia over the past 4 weeks despite taking his Metformin, has been feeling fatigued with some nausea, body aches and general weakness, no fevers, no abdominal pain, no chest pain or shortness of breath, no syncope.  Has also been dealing with balanitis for a month and has been using antifungal cream without any improvement.  On arrival patient was CBG of 558.  His hyperglycemia likely explains all of his symptoms and is also why his balanitis is likely not improving.  On exam patient with phimosis, only able to retract foreskin minimally with purulent drainage present.  Patient reports he is still able to urinate but has some burning and discomfort with urination.  Lab work initiated from triage with no leukocytosis and normal hemoglobin.  Glucose of 558 with sodium of 127, no other significant electrolyte derangements, but creatinine of 2.17 with BUN of 39, no prior renal function available for comparison but patient reports he has never been told previously that he  has had any problems with his kidney function, and I suspect this is acute in the setting  of hyperglycemia and dehydration.  Patient given IV fluids.  Urinalysis with small leukocytes present, but many bacteria and many WBCs present, likely in the setting of balanitis, but this could be causing superimposed UTI.  Will send urine culture will continue mycylog ointment and start patient on Rocephin.  Fortunately patient is not in DKA, but with hyperglycemia he has become dehydrated with likely AKI, and given IV fluids here in the ED, initially had plan to start patient on glucose stabilizer insulin protocol, but on recheck blood sugar of 334, so we will hold off and just give small bolus dose of IV insulin.  Patient has no PCP for close outpatient follow-up given laboratory derangements feel he would benefit from inpatient admission to ensure better glucose control and improvement in kidney function, feel this will also significantly improved balanitis.  Case discussed with Dr. Ralene Ok with [facility] who will see and admit the patient.  Final Clinical Impression(s) / ED Diagnoses Final diagnoses:  AKI (acute kidney injury) (HCC)  Hyperglycemia  Balanitis    Rx / DC Orders ED Discharge Orders    None       Legrand Rams 10/27/20 2335    Wynetta Fines, MD 10/28/20 2259

## 2020-10-28 ENCOUNTER — Other Ambulatory Visit (HOSPITAL_COMMUNITY): Payer: Self-pay | Admitting: Family Medicine

## 2020-10-28 DIAGNOSIS — N179 Acute kidney failure, unspecified: Secondary | ICD-10-CM

## 2020-10-28 LAB — COMPREHENSIVE METABOLIC PANEL
ALT: 29 U/L (ref 0–44)
AST: 19 U/L (ref 15–41)
Albumin: 3.4 g/dL — ABNORMAL LOW (ref 3.5–5.0)
Alkaline Phosphatase: 72 U/L (ref 38–126)
Anion gap: 11 (ref 5–15)
BUN: 31 mg/dL — ABNORMAL HIGH (ref 6–20)
CO2: 23 mmol/L (ref 22–32)
Calcium: 9.3 mg/dL (ref 8.9–10.3)
Chloride: 100 mmol/L (ref 98–111)
Creatinine, Ser: 1.26 mg/dL — ABNORMAL HIGH (ref 0.61–1.24)
GFR, Estimated: >60 mL/min (ref >60)
Glucose, Bld: 144 mg/dL — ABNORMAL HIGH (ref 70–99)
Potassium: 4 mmol/L (ref 3.5–5.1)
Sodium: 134 mmol/L — ABNORMAL LOW (ref 135–145)
Total Bilirubin: 1.3 mg/dL — ABNORMAL HIGH (ref 0.3–1.2)
Total Protein: 6.7 g/dL (ref 6.5–8.1)

## 2020-10-28 LAB — CBC
HCT: 39.2 % (ref 39.0–52.0)
Hemoglobin: 12.7 g/dL — ABNORMAL LOW (ref 13.0–17.0)
MCH: 31.1 pg (ref 26.0–34.0)
MCHC: 32.4 g/dL (ref 30.0–36.0)
MCV: 95.8 fL (ref 80.0–100.0)
Platelets: 197 10*3/uL (ref 150–400)
RBC: 4.09 MIL/uL — ABNORMAL LOW (ref 4.22–5.81)
RDW: 11.6 % (ref 11.5–15.5)
WBC: 8.4 10*3/uL (ref 4.0–10.5)
nRBC: 0 % (ref 0.0–0.2)

## 2020-10-28 LAB — LIPID PANEL
Cholesterol: 132 mg/dL (ref 0–200)
HDL: 23 mg/dL — ABNORMAL LOW (ref >40)
LDL Cholesterol: 77 mg/dL (ref 0–99)
Total CHOL/HDL Ratio: 5.7 RATIO
Triglycerides: 162 mg/dL — ABNORMAL HIGH (ref <150)
VLDL: 32 mg/dL (ref 0–40)

## 2020-10-28 LAB — CBG MONITORING, ED
Glucose-Capillary: 149 mg/dL — ABNORMAL HIGH (ref 70–99)
Glucose-Capillary: 125 mg/dL — ABNORMAL HIGH (ref 70–99)

## 2020-10-28 MED ORDER — NOVOLOG 70/30 FLEXPEN RELION (70-30) 100 UNIT/ML ~~LOC~~ SUPN
12.0000 [IU] | PEN_INJECTOR | Freq: Two times a day (BID) | SUBCUTANEOUS | 1 refills | Status: DC
Start: 2020-10-28 — End: 2020-11-10

## 2020-10-28 MED ORDER — GLIPIZIDE 10 MG PO TABS: 10.0000 mg | ORAL_TABLET | Freq: Two times a day (BID) | ORAL | 11 refills | Status: DC

## 2020-10-28 MED ORDER — BLOOD GLUCOSE MONITOR KIT: PACK | 0 refills | Status: AC

## 2020-10-28 MED ORDER — INSULIN GLARGINE 100 UNIT/ML ~~LOC~~ SOLN
15.0000 [IU] | Freq: Every day | SUBCUTANEOUS | 1 refills | Status: DC
Start: 2020-10-28 — End: 2020-10-28

## 2020-10-28 MED ORDER — INSULIN ASPART 100 UNIT/ML ~~LOC~~ SOLN: 0.0000 [IU] | Freq: Three times a day (TID) | SUBCUTANEOUS | 11 refills | Status: DC

## 2020-10-28 MED ORDER — LIVING WELL WITH DIABETES BOOK
Freq: Once | Status: AC
  Filled 2020-10-28: qty 1

## 2020-10-28 MED FILL — PENTIPS 32G X 4 MM MISC: 32G X 4 MM | 30 days supply | Qty: 100 | Fill #0

## 2020-10-28 MED FILL — NOVOLOG MIX 70-30 FLEXPEN S: (70-30) 100 | 30 days supply | Qty: 9 | Fill #0

## 2020-10-28 MED FILL — glipiZIDE 10 MG TABS: 10 | 30 days supply | Qty: 60 | Fill #0

## 2020-10-28 NOTE — ED Notes (Signed)
Breakfast Ordered 

## 2020-10-28 NOTE — ED Notes (Signed)
Pt sleeping at this time. Awoke briefly when this nurse placed BP cuff.

## 2020-10-28 NOTE — ED Notes (Signed)
Pt sleeping and in no distress at this time. Bilateral chest rise and fall noted. This RN will continue to monitor the patient at this time.

## 2020-10-28 NOTE — Discharge Instructions (Signed)
Hyperglycemia Hyperglycemia is when the sugar (glucose) level in your blood is too high. High blood sugar can happen to people who have or do not have diabetes. High blood sugar can happen quickly. It can be an emergency. What are the causes? If you have diabetes, high blood sugar may be caused by:  Medicines that increase blood sugar or affect your control of diabetes.  Getting less physical activity.  Overeating.  Being sick or injured or having an infection.  Having surgery.  Stress.  Not giving yourself enough insulin (if you are taking it). You may have high blood sugar because you have diabetes that has not been diagnosed yet. If you do not have diabetes, high blood sugar may be caused by:  Certain medicines.  Stress.  A bad illness.  An infection.  Having surgery.  Diseases of the pancreas. What increases the risk? This condition is more likely to develop in people who have risk factors for diabetes, such as:  Having a family member with diabetes.  Certain conditions in which the body's defense system (immune system) attacks itself. These are called autoimmune disorders.  Being overweight.  Not being active.  Having a condition called insulin resistance.  Having a history of: ? Prediabetes. ? Diabetes when pregnant. ? Polycystic ovarian syndrome (PCOS). What are the signs or symptoms? This condition may not cause symptoms. If you do have symptoms, they may include:  Feeling more thirsty than normal.  Needing to pee (urinate) more often than normal.  Hunger.  Feeling very tired.  Blurry eyesight (vision). You may get other symptoms as the condition gets worse, such as:  Dry mouth.  Pain in your belly (abdomen).  Not being hungry (loss of appetite).  Breath that smells fruity.  Weakness.  Weight loss that is not planned.  A tingling or numb feeling in your hands or feet.  A headache.  Cuts or bruises that heal slowly. How is this  treated? Treatment depends on the cause of your condition. Treatment may include:  Taking medicine to control your blood sugar levels.  Changing your medicine or dosage if you take insulin or other diabetes medicines.  Lifestyle changes. These may include: ? Exercising more. ? Eating healthier foods. ? Losing weight.  Treating an illness or infection.  Checking your blood sugar more often.  Stopping or reducing steroid medicines. If your condition gets very bad, you will need to be treated in the hospital. Follow these instructions at home: General instructions  Take over-the-counter and prescription medicines only as told by your doctor.  Do not smoke or use any products that contain nicotine or tobacco. If you need help quitting, ask your doctor.  If you drink alcohol: ? Limit how much you have to:  0-1 drink a day for women who are not pregnant.  0-2 drinks a day for men. ? Know how much alcohol is in a drink. In the [place] one drink equals one 12 oz bottle of beer (355 mL), one 5 oz glass of wine (148 mL), or one 1 oz glass of hard liquor (44 mL).  Manage stress. If you need help with this, ask your doctor.  Do exercises as told by your doctor.  Keep all follow-up visits. Eating and drinking  Stay at a healthy weight.  Make sure you drink enough fluid when you: ? Exercise. ? Get sick. ? Are in hot temperatures.  Drink enough fluid to keep your pee (urine) pale yellow.   If you   have diabetes:  Know the symptoms of high blood sugar.  Follow your diabetes management plan as told by your doctor. Make sure you: ? Take insulin and medicines as told. ? Follow your exercise plan. ? Follow your meal plan. Eat on time. Do not skip meals. ? Check your blood sugar as often as told. Make sure you check before and after exercise. If you exercise longer or in a different way, check your blood sugar more often. ? Follow your sick day plan whenever you cannot eat or drink  normally. Make this plan ahead of time with your doctor.  Share your diabetes management plan with people in your workplace, school, and household.  Check your pee for ketones when you are ill and as told by your doctor.  Carry a card or wear jewelry that says that you have diabetes.   Where to find more information American Diabetes Association: www.diabetes.org Contact a doctor if:  Your blood sugar level is at or above 240 mg/dL (13.3 mmol/L) for 2 days in a row.  You have problems keeping your blood sugar in your target range.  You have high blood pressure often.  You have signs of illness, such as: ? Feeling like you may vomit (feeling nauseous). ? Vomiting. ? A fever. Get help right away if:  Your blood sugar monitor reads "high" even when you are taking insulin.  You have trouble breathing.  You have a change in how you think, feel, or act (mental status).  You feel like you may vomit, and the feeling does not go away.  You cannot stop vomiting. These symptoms may be an emergency. Get medical help right away. Call your local emergency services (911 in the [place]  Do not wait to see if the symptoms will go away.  Do not drive yourself to the hospital. Summary  Hyperglycemia is when the sugar (glucose) level in your blood is too high.  High blood sugar can happen to people who have or do not have diabetes.  Make sure you drink enough fluids and follow your meal plan. Exercise as often as told by your doctor.  Contact your doctor if you have problems keeping your blood sugar in your target range. This information is not intended to replace advice given to you by your health care provider. Make sure you discuss any questions you have with your health care provider. Document Revised: 06/26/2020 Document Reviewed: 06/26/2020 Elsevier Patient Education  2021 Elsevier Inc.  

## 2020-10-28 NOTE — Discharge Summary (Addendum)
Physician Discharge Summary  Bobby Bates GUY:[id number] DOB: [birth year] DOA: 10/27/2020  PCP: Patient, No Pcp Per  Admit date: 10/27/2020 Discharge date: 10/28/2020 30 Day Unplanned Readmission Risk Score   Flowsheet Row ED from 10/27/2020 in [facility]  30 Day Unplanned Readmission Risk Score (%) 12.32 Filed at 10/28/2020 0801     This score is the patient's risk of an unplanned readmission within 30 days of being discharged (0 -100%). The score is based on dignosis, age, lab data, medications, orders, and past utilization.   Low:  0-14.9   Medium: 15-21.9   High: 22-29.9   Extreme: 30 and above         Admitted From: Home Disposition: Home  Recommendations for Outpatient Follow-up:  1. Follow up with PCP in 1-2 weeks 2. Please obtain BMP/CBC in one week 3. Please follow up with your PCP on the following pending results: Unresulted Labs (From admission, onward)          Start     Ordered   10/27/20 1705  Urine culture  ONCE - STAT,   STAT        10/27/20 1704            Home Health: None Equipment/Devices: None  Discharge Condition: Stable CODE STATUS: Full code Diet recommendation: Diabetic/consistent carbohydrate  Subjective: Seen and examined.  Patient feels much better and ready to go home.  HPI: Bobby Bates is a 55 y.o. male with medical history significant of DM2, HTN who presents from work with increased weakness. Reports weakness several weeks prior to visit, attributed to working 8hrs. Symptoms later progressed to dizziness several days prior to this visit. On the morning of visit while getting ready to go to work, pt reported more dizziness with blurred vision. Pt subsequently presented to ED for work up.   Of note, pt reports compliance with meds. [place] labs available in this facility. Denies chest pain or sob. Also reports last a1c from 19yrs ago noted to be 5.1  ED Course: In the ED, pt  was found to have glucose of 558 with Cr of 2.17. Repeat glucose was noted to be in the mid-300's. Bicarb noted to be 23 with normal anion gap. Pt was given 5 units IV insulin in ED and started on IVF hydration. Pt was also found to have UA suggestive of UTI. Pt was started on empiric rocephin. Hospitalist consulted for consideration for admission  Brief/Interim Summary: Patient presented with weakness and he was diagnosed with hypoglycemia which led to dehydration and AKI.  Patient was started on Lantus, sliding scale insulin as well as IV fluids.  He also came in with hyponatremia which is likely hypovolemic hyponatremia.  Patient's presenting creatinine was 2.17 which improved to 1.26.  This is very close to normal and patient does not know his baseline as he has not seen any physician in last 2 years.  He does not take any medication other than Metformin and does not check his blood sugar either.  Patient sodium improved to 134 as well.  For some reason, patient was diagnosed with UTI based on only leukoesterase and bacteria in the UA however patient has no UTI symptoms, he is afebrile with no leukocytosis and no suprapubic tenderness.  I do not think patient has UTI.  Patient will be seen by diabetes coordinator for education however he already feels comfortable injecting insulin himself.  His hemoglobin A1c was 12.  He does not recall his last hemoglobin A1c.  Based on all the data available, and due to the fact that he does not have insurance and thus he cannot afford Lantus, he is going to be discharged on Reli 70/30, 12 units twice daily per diabetes coordinator's recommendation as well as sliding scale insulin NovoLog as well as glipizide 10 mg p.o. twice daily and will continue Metformin previous home dose.  His home medications are being resumed.  He is comfortable going home.  He has been strongly advised to check his blood sugar 4 times a day and follow sliding's insulin and find a local PCP as he  has moved recently from out of town.  He verbalized understanding.  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) Active Problems:   HTN (hypertension)   Dehydration   Uncontrolled type 2 diabetes mellitus with hyperglycemia, without long-term current use of insulin ([place])   AKI (acute kidney injury) [facility])    Discharge Instructions   Allergies as of 10/28/2020      Reactions   Other Other (See Comments)   Sea food - swelling      Medication List    STOP taking these medications   aspirin EC 81 MG tablet   cyclobenzaprine 10 MG tablet Commonly known as: FLEXERIL   naproxen 500 MG tablet Commonly known as: NAPROSYN   NovoLOG FlexPen 100 UNIT/ML FlexPen Generic drug: insulin aspart Replaced by: insulin aspart 100 UNIT/ML injection     TAKE these medications   atorvastatin 20 MG tablet Commonly known as: LIPITOR Take 1 tablet (20 mg total) by mouth daily.   blood glucose meter kit and supplies Kit Dispense based on patient and insurance preference. Use up to four times daily as directed. (FOR ICD-9 250.00, 250.01).   glipiZIDE 10 MG tablet Commonly known as: GLUCOTROL Take 1 tablet (10 mg total) by mouth 2 (two) times daily.   insulin aspart 100 UNIT/ML injection Commonly known as: novoLOG Inject 0-15 Units into the skin 3 (three) times daily with meals. CBG 70 - 120: 0 units CBG 121 - 150: 0 units CBG 151 - 200: 0 units CBG 201 - 250: 2 units CBG 251 - 300: 3 units CBG 301 - 350: 4 units CBG 351 - 400: 5 units Replaces: NovoLOG FlexPen 100 UNIT/ML FlexPen   lisinopril 20 MG tablet Commonly known as: ZESTRIL Take 20 mg by mouth daily. Pt unsure of dosage strength   metFORMIN 1000 MG tablet Commonly known as: GLUCOPHAGE Take 1 tablet (1,000 mg total) by mouth 2 (two) times daily with a meal.   NovoLOG 70/30 FlexPen ReliOn (70-30) 100 UNIT/ML FlexPen Generic drug: insulin aspart  protamine - aspart Inject 0.12 mLs (12 Units total) into the skin 2 (two) times daily with a meal.   nystatin-triamcinolone cream Commonly known as: MYCOLOG II Apply to affected area twice daily.       Allergies  Allergen Reactions  . Other Other (See Comments)    Sea food - swelling    Consultations: None   Procedures/Studies: No results found.   Discharge Exam: Vitals:   10/28/20 0732 10/28/20 1059  BP: 121/82 (!) 142/93  Pulse: 70 68  Resp: 18 18  Temp:    SpO2: 97% 97%   Vitals:   10/28/20 0130 10/28/20 0330 10/28/20 0732 10/28/20 1059  BP:  105/76 121/82 (!) 142/93  Pulse: 68 62 70 68  Resp:  16 18  18  Temp:      TempSrc:      SpO2: 96% 97% 97% 97%  Weight:      Height:        General: Pt is alert, awake, not in acute distress Cardiovascular: RRR, S1/S2 +, no rubs, no gallops Respiratory: CTA bilaterally, no wheezing, no rhonchi Abdominal: Soft, NT, ND, bowel sounds + Extremities: no edema, no cyanosis    The results of significant diagnostics from this hospitalization (including imaging, microbiology, ancillary and laboratory) are listed below for reference.     Microbiology: Recent Results (from the past 240 hour(s))  SARS CORONAVIRUS 2 (TAT 6-24 HRS) Nasopharyngeal Nasopharyngeal Swab     Status: None   Collection Time: 10/27/20  5:06 PM   Specimen: Nasopharyngeal Swab  Result Value Ref Range Status   SARS Coronavirus 2 NEGATIVE NEGATIVE Final    Comment: (NOTE) SARS-CoV-2 target nucleic acids are NOT DETECTED.  The SARS-CoV-2 RNA is generally detectable in upper and lower respiratory specimens during the acute phase of infection. Negative results do not preclude SARS-CoV-2 infection, do not rule out co-infections with other pathogens, and should not be used as the sole basis for treatment or other patient management decisions. Negative results must be combined with clinical observations, patient history, and epidemiological information.  The expected result is Negative.  Fact Sheet for Patients: SugarRoll.be  Fact Sheet for Healthcare Providers: https://www.woods-mathews.com/  This test is not yet approved or cleared by the [place] FDA and  has been authorized for detection and/or diagnosis of SARS-CoV-2 by FDA under an Emergency Use Authorization (EUA). This EUA will remain  in effect (meaning this test can be used) for the duration of the COVID-19 declaration under Se ction 564(b)(1) of the Act, 21 U.S.C. section 360bbb-3(b)(1), unless the authorization is terminated or revoked sooner.  Performed at [facility], [place] [address], [place], [place] [address]      Labs: BNP (last 3 results) No results for input(s): BNP in the last 8760 hours. Basic Metabolic Panel: Recent Labs  Lab 10/27/20 1242 10/28/20 0431  NA 127* 134*  K 4.2 4.0  CL 93* 100  CO2 23 23  GLUCOSE 558* 144*  BUN 39* 31*  CREATININE 2.17* 1.26*  CALCIUM 9.4 9.3   Liver Function Tests: Recent Labs  Lab 10/27/20 1722 10/28/20 0431  AST 22 19  ALT 33 29  ALKPHOS 83 72  BILITOT 1.4* 1.3*  PROT 7.9 6.7  ALBUMIN 4.1 3.4*   Recent Labs  Lab 10/27/20 1722  LIPASE 27   No results for input(s): AMMONIA in the last 168 hours. CBC: Recent Labs  Lab 10/27/20 1242 10/28/20 0431  WBC 9.3 8.4  HGB 13.0 12.7*  HCT 39.5 39.2  MCV 94.7 95.8  PLT 209 197   Cardiac Enzymes: No results for input(s): CKTOTAL, CKMB, CKMBINDEX, TROPONINI in the last 168 hours. BNP: Invalid input(s): POCBNP CBG: Recent Labs  Lab 10/27/20 1716 10/27/20 2110 10/28/20 0732  GLUCAP 334* 287* 149*   D-Dimer No results for input(s): DDIMER in the last 72 hours. Hgb A1c Recent Labs    10/27/20 2127  HGBA1C 12.0*   Lipid Profile Recent Labs    10/28/20 0430  CHOL 132  HDL 23*  LDLCALC 77  TRIG 162*  CHOLHDL 5.7   Thyroid function studies No results for input(s): TSH, T4TOTAL, T3FREE,  THYROIDAB in the last 72 hours.  Invalid input(s): FREET3 Anemia work up No results for input(s): VITAMINB12, FOLATE, FERRITIN,  TIBC, IRON, RETICCTPCT in the last 72 hours. Urinalysis    Component Value Date/Time   COLORURINE YELLOW 10/27/2020 [place] 10/27/2020 1326   LABSPEC 1.020 10/27/2020 1326   PHURINE 5.0 10/27/2020 1326   GLUCOSEU >=500 (A) 10/27/2020 1326   HGBUR NEGATIVE 10/27/2020 1326   BILIRUBINUR NEGATIVE 10/27/2020 1326   KETONESUR NEGATIVE 10/27/2020 1326   PROTEINUR NEGATIVE 10/27/2020 1326   UROBILINOGEN 0.2 09/28/2020 1034   NITRITE NEGATIVE 10/27/2020 1326   LEUKOCYTESUR SMALL (A) 10/27/2020 1326   Sepsis Labs Invalid input(s): PROCALCITONIN,  WBC,  LACTICIDVEN Microbiology Recent Results (from the past 240 hour(s))  SARS CORONAVIRUS 2 (TAT 6-24 HRS) Nasopharyngeal Nasopharyngeal Swab     Status: None   Collection Time: 10/27/20  5:06 PM   Specimen: Nasopharyngeal Swab  Result Value Ref Range Status   SARS Coronavirus 2 NEGATIVE NEGATIVE Final    Comment: (NOTE) SARS-CoV-2 target nucleic acids are NOT DETECTED.  The SARS-CoV-2 RNA is generally detectable in upper and lower respiratory specimens during the acute phase of infection. Negative results do not preclude SARS-CoV-2 infection, do not rule out co-infections with other pathogens, and should not be used as the sole basis for treatment or other patient management decisions. Negative results must be combined with clinical observations, patient history, and epidemiological information. The expected result is Negative.  Fact Sheet for Patients: SugarRoll.be  Fact Sheet for Healthcare Providers: https://www.woods-mathews.com/  This test is not yet approved or cleared by the [place] FDA and  has been authorized for detection and/or diagnosis of SARS-CoV-2 by FDA under an Emergency Use Authorization (EUA). This EUA will remain  in  effect (meaning this test can be used) for the duration of the COVID-19 declaration under Se ction 564(b)(1) of the Act, 21 U.S.C. section 360bbb-3(b)(1), unless the authorization is terminated or revoked sooner.  Performed at [facility], [place] [address] [place], [place] [address]      Time coordinating discharge: Over 30 minutes  SIGNED:   Darliss Cheney, MD  [facility] 10/28/2020, 11:28 AM  If 7PM-7AM, please contact night-coverage [email or web address]

## 2020-10-28 NOTE — Progress Notes (Signed)
Inpatient Diabetes Program Recommendations  AACE/ADA: New Consensus Statement on Inpatient Glycemic Control (2015)  Target Ranges:  Prepandial:   less than 140 mg/dL      Peak postprandial:   less than 180 mg/dL (1-2 hours)      Critically ill patients:  140 - 180 mg/dL   Lab Results  Component Value Date   GLUCAP 125 (H) 10/28/2020   HGBA1C 12.0 (H) 10/27/2020    Review of Glycemic Control Results for Bobby Bates, Bobby Bates (MRN [id number]) as of 10/28/2020 13:13  Ref. Range 10/27/2020 17:16 10/27/2020 21:10 10/28/2020 07:32 10/28/2020 11:36  Glucose-Capillary Latest Ref Range: 70 - 99 mg/dL 998 (H) 338 (H) 250 (H) 125 (H)   Diabetes history: DM 2  Outpatient Diabetes medications: Metformin 1000 mg bid Current orders for Inpatient glycemic control:  Novolog moderate tid with meals and HS Novolog 5 units tid with meals Lantus 20 units q HS  Inpatient Diabetes Program Recommendations:    Spoke with patient at bedside.  He currently does not have insurance and states that the plan at his work is too expensive.  Discussed cheaper medication options from [facility].  Gave him list of Reli-on medications.  He states that he prefers the insulin pen.  He was given [facility] on this hospital admission but plans to get meds from [facility] in the future.  States that wife has given him insulin in the past but he has never self-administered.  Reviewed with patient proper technique and RN brought in Novolog for patient to administer to himself.  Patient did a great job injecting insulin into abdomen.  Discussed use of insulin pen.  Demonstrated use including 2 unit prime, dialing dose, 90 degree angle, and injection technique (hold in place for 6-10 seconds).  Also showed him how to apply and take off insulin pen needle.  TOC pharmacy delivered medications but no meter.  Gave patient REli-on meter from [facility] as well. We discussed at length normal blood sugar levels, monitoring, hypoglycemia (how to treat and signs and  symptoms).  Cost is a barrier so discussed at length low cost alternatives at [facility]. Patient appreciative.  States that care manager was going to help with PCP.  Needs follow-up.  Thanks,  Beryl Meager, RN, BC-ADM Inpatient Diabetes Coordinator Pager [phone] (8a-5p)

## 2020-10-28 NOTE — Discharge Planning (Signed)
RNCM consulted for medication assistance. RNCM reviewed chart and spoke with the pt about [place] MATCH program ($3 co pay for each Rx through [facility] program, does not include refills, 7 day expiration of MATCH letter and choice of pharmacies). Pt is eligible for [facility] MATCH program (unable to find pt listed in PROCARE per cardholder name inquiry) and has agreed to accept [facility] with co-pay override. PROCARE information entered. MATCH letter completed and provided to pt.  RNCM updated EDP and ED RN.

## 2020-10-29 LAB — URINE CULTURE: Culture: 10000 — AB

## 2020-11-10 ENCOUNTER — Ambulatory Visit (INDEPENDENT_AMBULATORY_CARE_PROVIDER_SITE_OTHER): Payer: Self-pay | Admitting: Primary Care

## 2020-11-10 ENCOUNTER — Other Ambulatory Visit: Payer: Self-pay

## 2020-11-10 ENCOUNTER — Encounter (INDEPENDENT_AMBULATORY_CARE_PROVIDER_SITE_OTHER): Payer: Self-pay | Admitting: Primary Care

## 2020-11-10 ENCOUNTER — Other Ambulatory Visit: Payer: Self-pay | Admitting: Primary Care

## 2020-11-10 VITALS — BP 137/85 | HR 67 | Temp 97.9°F | Ht 73.0 in | Wt 239.8 lb

## 2020-11-10 DIAGNOSIS — E1129 Type 2 diabetes mellitus with other diabetic kidney complication: Secondary | ICD-10-CM

## 2020-11-10 DIAGNOSIS — Z23 Encounter for immunization: Secondary | ICD-10-CM

## 2020-11-10 DIAGNOSIS — N179 Acute kidney failure, unspecified: Secondary | ICD-10-CM

## 2020-11-10 DIAGNOSIS — E1165 Type 2 diabetes mellitus with hyperglycemia: Secondary | ICD-10-CM

## 2020-11-10 DIAGNOSIS — Z7689 Persons encountering health services in other specified circumstances: Secondary | ICD-10-CM

## 2020-11-10 DIAGNOSIS — Z09 Encounter for follow-up examination after completed treatment for conditions other than malignant neoplasm: Secondary | ICD-10-CM

## 2020-11-10 DIAGNOSIS — I159 Secondary hypertension, unspecified: Secondary | ICD-10-CM

## 2020-11-10 MED ORDER — NOVOLOG 70/30 FLEXPEN RELION (70-30) 100 UNIT/ML ~~LOC~~ SUPN: 12.0000 [IU] | PEN_INJECTOR | Freq: Two times a day (BID) | SUBCUTANEOUS | 3 refills | Status: DC

## 2020-11-10 MED ORDER — LISINOPRIL 20 MG PO TABS
20.0000 mg | ORAL_TABLET | Freq: Every day | ORAL | 1 refills | Status: AC
Start: 2020-11-10 — End: ?

## 2020-11-10 MED ORDER — INSULIN ASPART 100 UNIT/ML ~~LOC~~ SOLN
0.0000 [IU] | Freq: Three times a day (TID) | SUBCUTANEOUS | 11 refills | Status: AC
Start: 2020-11-10 — End: ?

## 2020-11-10 MED FILL — LISINOPRIL 20 MG TABLET: 20 | 30 days supply | Qty: 30 | Fill #0

## 2020-11-10 NOTE — Patient Instructions (Signed)
Tdap (Tetanus, Diphtheria, Pertussis) Vaccine: What You Need to Know 1. Why get vaccinated? Tdap vaccine can prevent tetanus, diphtheria, and pertussis. Diphtheria and pertussis spread from person to person. Tetanus enters the body through cuts or wounds.  TETANUS (T) causes painful stiffening of the muscles. Tetanus can lead to serious health problems, including being unable to open the mouth, having trouble swallowing and breathing, or death.  DIPHTHERIA (D) can lead to difficulty breathing, heart failure, paralysis, or death.  PERTUSSIS (aP), also known as "whooping cough," can cause uncontrollable, violent coughing that makes it hard to breathe, eat, or drink. Pertussis can be extremely serious especially in babies and young children, causing pneumonia, convulsions, brain damage, or death. In teens and adults, it can cause weight loss, loss of bladder control, passing out, and rib fractures from severe coughing. 2. Tdap vaccine Tdap is only for children 7 years and older, adolescents, and adults.  Adolescents should receive a single dose of Tdap, preferably at age 11 or 12 years. Pregnant people should get a dose of Tdap during every pregnancy, preferably during the early part of the third trimester, to help protect the newborn from pertussis. Infants are most at risk for severe, life-threatening complications from pertussis. Adults who have never received Tdap should get a dose of Tdap. Also, adults should receive a booster dose of either Tdap or Td (a different vaccine that protects against tetanus and diphtheria but not pertussis) every 10 years, or after 5 years in the case of a severe or dirty wound or burn. Tdap may be given at the same time as other vaccines. 3. Talk with your health care provider Tell your vaccine provider if the person getting the vaccine:  Has had an allergic reaction after a previous dose of any vaccine that protects against tetanus, diphtheria, or pertussis, or  has any severe, life-threatening allergies  Has had a coma, decreased level of consciousness, or prolonged seizures within 7 days after a previous dose of any pertussis vaccine (DTP, DTaP, or Tdap)  Has seizures or another nervous system problem  Has ever had Guillain-Barr Syndrome (also called "GBS")  Has had severe pain or swelling after a previous dose of any vaccine that protects against tetanus or diphtheria In some cases, your health care provider may decide to postpone Tdap vaccination until a future visit. People with minor illnesses, such as a cold, may be vaccinated. People who are moderately or severely ill should usually wait until they recover before getting Tdap vaccine.  Your health care provider can give you more information. 4. Risks of a vaccine reaction  Pain, redness, or swelling where the shot was given, mild fever, headache, feeling tired, and nausea, vomiting, diarrhea, or stomachache sometimes happen after Tdap vaccination. People sometimes faint after medical procedures, including vaccination. Tell your provider if you feel dizzy or have vision changes or ringing in the ears.  As with any medicine, there is a very remote chance of a vaccine causing a severe allergic reaction, other serious injury, or death. 5. What if there is a serious problem? An allergic reaction could occur after the vaccinated person leaves the clinic. If you see signs of a severe allergic reaction (hives, swelling of the face and throat, difficulty breathing, a fast heartbeat, dizziness, or weakness), call 9-1-1 and get the person to the nearest hospital. For other signs that concern you, call your health care provider.  Adverse reactions should be reported to the Vaccine Adverse Event Reporting System (VAERS). Your health   care provider will usually file this report, or you can do it yourself. Visit the VAERS website at www.vaers.hhs.gov or call 1-800-822-7967. VAERS is only for reporting  reactions, and VAERS staff members do not give medical advice. 6. The National Vaccine Injury Compensation Program The National Vaccine Injury Compensation Program (VICP) is a federal program that was created to compensate people who may have been injured by certain vaccines. Claims regarding alleged injury or death due to vaccination have a time limit for filing, which may be as short as two years. Visit the VICP website at www.hrsa.gov/vaccinecompensation or call 1-800-338-2382 to learn about the program and about filing a claim. 7. How can I learn more?  Ask your health care provider.  Call your local or state health department.  Visit the website of the Food and Drug Administration (FDA) for vaccine package inserts and additional information at www.fda.gov/vaccines-blood-biologics/vaccines.  Contact the Centers for Disease Control and Prevention (CDC): ? Call 1-800-232-4636 (1-800-CDC-INFO) or ? Visit CDC's website at www.cdc.gov/vaccines. Vaccine Information Statement Tdap (Tetanus, Diphtheria, Pertussis) Vaccine (05/01/2020) This information is not intended to replace advice given to you by your health care provider. Make sure you discuss any questions you have with your health care provider. Document Revised: 05/27/2020 Document Reviewed: 05/27/2020 Elsevier Patient Education  2021 Elsevier Inc.  

## 2020-11-10 NOTE — Progress Notes (Incomplete)
New Patient Office Visit  Subjective:  Patient ID: Bobby Bates, male    DOB: 11-Aug-1966  Age: 55 y.o. MRN: [id number]  CC:  Chief Complaint  Patient presents with  . Hospitalization Follow-up    Acute AKI     HPI Bobby Bates presents for ***  Past Medical History:  Diagnosis Date  . Diabetes mellitus without complication ([place])   . Hypertension     Past Surgical History:  Procedure Laterality Date  . LEG SURGERY Right     Family History  Problem Relation Age of Onset  . Kidney failure Father     Social History   Socioeconomic History  . Marital status: Married    Spouse name: Not on file  . Number of children: Not on file  . Years of education: Not on file  . Highest education level: Not on file  Occupational History  . Not on file  Tobacco Use  . Smoking status: Former Smoker    Packs/day: 0.50    Years: 10.00    Pack years: 5.00    Types: Cigarettes    Quit date: 02/04/2020    Years since quitting: 0.7  . Smokeless tobacco: Never Used  Substance and Sexual Activity  . Alcohol use: Never  . Drug use: Never  . Sexual activity: Not on file  Other Topics Concern  . Not on file  Social History Narrative  . Not on file   Social Determinants of Health   Financial Resource Strain: Not on file  Food Insecurity: Not on file  Transportation Needs: Not on file  Physical Activity: Not on file  Stress: Not on file  Social Connections: Not on file  Intimate Partner Violence: Not on file    ROS Review of Systems  Objective:   Today's Vitals: BP 137/85 (BP Location: Right Arm, Patient Position: Sitting, Cuff Size: Large)   Pulse 67   Temp 97.9 F (36.6 C) (Temporal)   Ht _0  (1.854 m)   Wt 239 lb 12.8 oz (108.8 kg)   SpO2 96%   BMI 31.64 kg/m   Physical Exam  Assessment & Plan:   Problem List Items Addressed This Visit   None     Outpatient Encounter Medications as of 11/10/2020  Medication Sig  . atorvastatin (LIPITOR) 20 MG tablet  Take 1 tablet (20 mg total) by mouth daily.  . blood glucose meter kit and supplies KIT Dispense based on patient and insurance preference. Use up to four times daily as directed. (FOR ICD-9 250.00, 250.01).  Marland Kitchen glipiZIDE (GLUCOTROL) 10 MG tablet Take 1 tablet (10 mg total) by mouth 2 (two) times daily.  . insulin aspart protamine - aspart (NOVOLOG 70/30 FLEXPEN RELION) (70-30) 100 UNIT/ML FlexPen Inject 0.12 mLs (12 Units total) into the skin 2 (two) times daily with a meal.  . lisinopril (ZESTRIL) 20 MG tablet Take 20 mg by mouth daily. Pt unsure of dosage strength  . metFORMIN (GLUCOPHAGE) 1000 MG tablet Take 1 tablet (1,000 mg total) by mouth 2 (two) times daily with a meal.  . PENTIPS 32G X 4 MM MISC See admin instructions. with insulin  . insulin aspart (NOVOLOG) 100 UNIT/ML injection Inject 0-15 Units into the skin 3 (three) times daily with meals. CBG 70 - 120: 0 units CBG 121 - 150: 0 units CBG 151 - 200: 0 units CBG 201 - 250: 2 units CBG 251 - 300: 3 units CBG 301 - 350: 4 units CBG 351 - 400: 5  units (Patient not taking: Reported on 11/10/2020)  . nystatin-triamcinolone (MYCOLOG II) cream Apply to affected area twice daily. (Patient not taking: No sig reported)   No facility-administered encounter medications on file as of 11/10/2020.    Follow-up: No follow-ups on file.   Kerin Perna, NP  Assessment and Plan:    HPI 55 y.o.male presents for follow up from the hospital. Admit date to the hospital was 10/27/20, patient was discharged from the hospital on 10/28/20, patient was admitted for:   Past Medical History:  Diagnosis Date  . Diabetes mellitus without complication ([place])   . Hypertension      Allergies  Allergen Reactions  . Other Other (See Comments)    Sea food - swelling      Current Outpatient Medications on File Prior to Visit  Medication Sig Dispense Refill  . atorvastatin (LIPITOR) 20 MG tablet Take 1 tablet (20 mg total) by mouth daily. 90 tablet 0   . blood glucose meter kit and supplies KIT Dispense based on patient and insurance preference. Use up to four times daily as directed. (FOR ICD-9 250.00, 250.01). 1 each 0  . glipiZIDE (GLUCOTROL) 10 MG tablet Take 1 tablet (10 mg total) by mouth 2 (two) times daily. 60 tablet 11  . insulin aspart protamine - aspart (NOVOLOG 70/30 FLEXPEN RELION) (70-30) 100 UNIT/ML FlexPen Inject 0.12 mLs (12 Units total) into the skin 2 (two) times daily with a meal. 15 mL 1  . lisinopril (ZESTRIL) 20 MG tablet Take 20 mg by mouth daily. Pt unsure of dosage strength    . metFORMIN (GLUCOPHAGE) 1000 MG tablet Take 1 tablet (1,000 mg total) by mouth 2 (two) times daily with a meal. 180 tablet 0  . PENTIPS 32G X 4 MM MISC See admin instructions. with insulin    . insulin aspart (NOVOLOG) 100 UNIT/ML injection Inject 0-15 Units into the skin 3 (three) times daily with meals. CBG 70 - 120: 0 units CBG 121 - 150: 0 units CBG 151 - 200: 0 units CBG 201 - 250: 2 units CBG 251 - 300: 3 units CBG 301 - 350: 4 units CBG 351 - 400: 5 units (Patient not taking: Reported on 11/10/2020) 10 mL 11  . nystatin-triamcinolone (MYCOLOG II) cream Apply to affected area twice daily. (Patient not taking: No sig reported) 30 g 0   No current facility-administered medications on file prior to visit.    ROS: all negative except above.   Physical Exam: Filed Weights   11/10/20 0922  Weight: 239 lb 12.8 oz (108.8 kg)   BP 137/85 (BP Location: Right Arm, Patient Position: Sitting, Cuff Size: Large)   Pulse 67   Temp 97.9 F (36.6 C) (Temporal)   Ht _0  (1.854 m)   Wt 239 lb 12.8 oz (108.8 kg)   SpO2 96%   BMI 31.64 kg/m  General Appearance: Well nourished, in no apparent distress. Eyes: PERRLA, EOMs, conjunctiva no swelling or erythema Sinuses: No Frontal/maxillary tenderness ENT/Mouth: Ext aud canals clear, TMs without erythema, bulging. No erythema, swelling, or exudate on post pharynx.  Tonsils not swollen or  erythematous. Hearing normal.  Neck: Supple, thyroid normal.  Respiratory: Respiratory effort normal, BS equal bilaterally without rales, rhonchi, wheezing or stridor.  Cardio: RRR with no MRGs. Brisk peripheral pulses without edema.  Abdomen: Soft, + BS.  Non tender, no guarding, rebound, hernias, masses. Lymphatics: Non tender without lymphadenopathy.  Musculoskeletal: Full ROM, 5/5 strength, normal gait.  Skin: Warm, dry without  rashes, lesions, ecchymosis.  Neuro: Cranial nerves intact. Normal muscle tone, no cerebellar symptoms. Sensation intact.  Psych: Awake and oriented X 3, normal affect, Insight and Judgment appropriate.     Kerin Perna, NP 9:48 AM [facility]

## 2020-11-10 NOTE — Progress Notes (Signed)
Vision impaired

## 2020-11-10 NOTE — Progress Notes (Signed)
Established Patient Office Visit  Subjective:  Patient ID: Bobby Bates, male    DOB: 02-May-1966  Age: 55 y.o. MRN: [id number]  CC:  Chief Complaint  Patient presents with  . Hospitalization Follow-up    Acute AKI     HPI Bobby Bates 55 y.o.male presents for follow up from the hospital. Admit date to the hospital was 10/27/20, patient was discharged from the hospital on 10/28/20, patient was admitted for: Acute kidney injury.  Patient was concerned about diagnosis of acute kidney injury and what it meant.  Explained this was a secondary cause of uncontrolled diabetes.  Presented to the emergency room with a CBG greater than 500.  Also noted WBCs and leukocytes and urinalysis.  He is also establishing care with the management of type 2 diabetes and hypertension.  He denies polyphagia and polydipsia.  He does have polyuria and visual changes  He also denies shortness of breath, headaches, chest pain or lower extremity edema  Past Medical History:  Diagnosis Date  . Diabetes mellitus without complication ([place])   . Hypertension     Past Surgical History:  Procedure Laterality Date  . LEG SURGERY Right     Family History  Problem Relation Age of Onset  . Kidney failure Father     Social History   Socioeconomic History  . Marital status: Married    Spouse name: Not on file  . Number of children: Not on file  . Years of education: Not on file  . Highest education level: Not on file  Occupational History  . Not on file  Tobacco Use  . Smoking status: Former Smoker    Packs/day: 0.50    Years: 10.00    Pack years: 5.00    Types: Cigarettes    Quit date: 02/04/2020    Years since quitting: 0.7  . Smokeless tobacco: Never Used  Substance and Sexual Activity  . Alcohol use: Never  . Drug use: Never  . Sexual activity: Not on file  Other Topics Concern  . Not on file  Social History Narrative  . Not on file   Social Determinants of Health   Financial Resource Strain:  Not on file  Food Insecurity: Not on file  Transportation Needs: Not on file  Physical Activity: Not on file  Stress: Not on file  Social Connections: Not on file  Intimate Partner Violence: Not on file    Outpatient Medications Prior to Visit  Medication Sig Dispense Refill  . atorvastatin (LIPITOR) 20 MG tablet Take 1 tablet (20 mg total) by mouth daily. 90 tablet 0  . blood glucose meter kit and supplies KIT Dispense based on patient and insurance preference. Use up to four times daily as directed. (FOR ICD-9 250.00, 250.01). 1 each 0  . glipiZIDE (GLUCOTROL) 10 MG tablet Take 1 tablet (10 mg total) by mouth 2 (two) times daily. 60 tablet 11  . insulin aspart protamine - aspart (NOVOLOG 70/30 FLEXPEN RELION) (70-30) 100 UNIT/ML FlexPen Inject 0.12 mLs (12 Units total) into the skin 2 (two) times daily with a meal. 15 mL 1  . lisinopril (ZESTRIL) 20 MG tablet Take 20 mg by mouth daily. Pt unsure of dosage strength    . metFORMIN (GLUCOPHAGE) 1000 MG tablet Take 1 tablet (1,000 mg total) by mouth 2 (two) times daily with a meal. 180 tablet 0  . PENTIPS 32G X 4 MM MISC See admin instructions. with insulin    . insulin aspart (NOVOLOG) 100 UNIT/ML  injection Inject 0-15 Units into the skin 3 (three) times daily with meals. CBG 70 - 120: 0 units CBG 121 - 150: 0 units CBG 151 - 200: 0 units CBG 201 - 250: 2 units CBG 251 - 300: 3 units CBG 301 - 350: 4 units CBG 351 - 400: 5 units (Patient not taking: Reported on 11/10/2020) 10 mL 11  . nystatin-triamcinolone (MYCOLOG II) cream Apply to affected area twice daily. (Patient not taking: No sig reported) 30 g 0   No facility-administered medications prior to visit.    Allergies  Allergen Reactions  . Other Other (See Comments)    Sea food - swelling    ROS Review of Systems Pertinent positives and negatives as noted in HPI   Objective:    Physical Exam  BP 137/85 (BP Location: Right Arm, Patient Position: Sitting, Cuff Size:  Large)   Pulse 67   Temp 97.9 F (36.6 C) (Temporal)   Ht 6' 1"  (1.854 m)   Wt 239 lb 12.8 oz (108.8 kg)   SpO2 96%   BMI 31.64 kg/m  Wt Readings from Last 3 Encounters:  11/10/20 239 lb 12.8 oz (108.8 kg)  10/27/20 250 lb (113.4 kg)  10/12/20 252 lb (114.3 kg)   BMI 31.64 kg/m  General Appearance: Well nourished, obese male in no apparent distress. Eyes: PERRLA, EOMs, conjunctiva no swelling or erythema Sinuses: No Frontal/maxillary tenderness ENT/Mouth: Ext aud canals clear, TMs without erythema, bulging.  Hearing is normal  respiratory: Respiratory effort normal, BS equal bilaterally without rales, rhonchi, wheezing or stridor.  Cardio: RRR with no MRGs. Brisk peripheral pulses without edema.  Abdomen: Soft, + BS.  Non tender, no guarding, rebound, hernias, masses. Lymphatics: Non tender without lymphadenopathy.  Musculoskeletal: Full ROM, 5/5 strength, normal gait.  Skin: Warm, dry without rashes, lesions, ecchymosis.  Neuro: Cranial nerves intact. Normal muscle tone, no cerebellar symptoms. Sensation intact.  Psych: Awake and oriented X 3, normal affect, Insight and Judgment appropriate.    Buel was seen today for hospitalization follow-up.  Diagnoses and all orders for this visit:  AKI (acute kidney injury) ([place]) Secondary to urinary tract infection and uncontrolled diabetes.  Treated with antibiotics in the emergency room -     lisinopril (ZESTRIL) 20 MG tablet; Take 1 tablet (20 mg total) by mouth daily. Pt unsure of dosage strength  Secondary hypertension Noted medication for blood pressure and renal protection. Counseled on blood pressure goal of less than 130/80, low-sodium, DASH diet, medication compliance, 150 minutes of moderate intensity exercise per week. Discussed medication compliance, adverse effects. -     lisinopril (ZESTRIL) 20 MG tablet; Take 1 tablet (20 mg total) by mouth daily. 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 -     insulin aspart (NOVOLOG) 100 UNIT/ML injection; Inject 0-15 Units into the skin 3 (three) times daily with meals. CBG 70 - 120: 0 units CBG 121 - 150: 0 units CBG 151 - 200: 0 units CBG 201 - 250: 2 units CBG 251 - 300: 3 units CBG 301 - 350: 4 units CBG 351 - 400: 5 units Discussed  co- morbidities with uncontrol diabetes  Complications -diabetic retinopathy, (close your eyes ? What do you see nothing) nephropathy decrease in  kidney function- can lead to dialysis-on a machine 3 days a week to filter your kidney, neuropathy- numbness and tinging in your hands and feet,  increase risk of heart attack and stroke, and amputation due to decrease wound healing and circulation. Decrease your risk by taking medication daily as prescribed, monitor carbohydrates- foods that are high in carbohydrates are the following rice, potatoes, breads, sugars, and pastas.  Reduction in the intake (eating) will assist in lowering your blood sugars. Exercise daily at least 30 minutes daily.  Hospital discharge follow-up To establish care and to manage type 2 diabetes and hypertension   Encounter to establish care Establish care with new provider  Other orders -     Tdap vaccine greater than or equal to 7yo IM  Kerin Perna, NP

## 2021-02-08 ENCOUNTER — Ambulatory Visit (INDEPENDENT_AMBULATORY_CARE_PROVIDER_SITE_OTHER): Payer: Medicaid Other | Admitting: Primary Care

## 2021-06-11 ENCOUNTER — Encounter (HOSPITAL_COMMUNITY): Payer: Self-pay

## 2021-06-11 ENCOUNTER — Other Ambulatory Visit: Payer: Self-pay

## 2021-06-11 ENCOUNTER — Emergency Department (HOSPITAL_COMMUNITY)
Admission: EM | Admit: 2021-06-11 | Discharge: 2021-06-11 | Disposition: A | Discharge status: Home / Self Care | Payer: Medicaid Other | Attending: Emergency Medicine | Admitting: Emergency Medicine

## 2021-06-11 DIAGNOSIS — Z87891 Personal history of nicotine dependence: Secondary | ICD-10-CM | POA: Insufficient documentation

## 2021-06-11 DIAGNOSIS — R739 Hyperglycemia, unspecified: Secondary | ICD-10-CM

## 2021-06-11 DIAGNOSIS — Z7984 Long term (current) use of oral hypoglycemic drugs: Secondary | ICD-10-CM | POA: Insufficient documentation

## 2021-06-11 DIAGNOSIS — I10 Essential (primary) hypertension: Secondary | ICD-10-CM | POA: Insufficient documentation

## 2021-06-11 DIAGNOSIS — Z794 Long term (current) use of insulin: Secondary | ICD-10-CM | POA: Insufficient documentation

## 2021-06-11 DIAGNOSIS — E1165 Type 2 diabetes mellitus with hyperglycemia: Secondary | ICD-10-CM | POA: Insufficient documentation

## 2021-06-11 DIAGNOSIS — E1129 Type 2 diabetes mellitus with other diabetic kidney complication: Secondary | ICD-10-CM | POA: Insufficient documentation

## 2021-06-11 LAB — CBC WITH DIFFERENTIAL/PLATELET
Abs Immature Granulocytes: 0.02 10*3/uL (ref 0.00–0.07)
Basophils Absolute: 0 10*3/uL (ref 0.0–0.1)
Basophils Relative: 1 %
Eosinophils Absolute: 0.3 10*3/uL (ref 0.0–0.5)
Eosinophils Relative: 5 %
HCT: 44 % (ref 39.0–52.0)
Hemoglobin: 14.6 g/dL (ref 13.0–17.0)
Immature Granulocytes: 0 %
Lymphocytes Relative: 41 %
Lymphs Abs: 2.5 10*3/uL (ref 0.7–4.0)
MCH: 31.1 pg (ref 26.0–34.0)
MCHC: 33.2 g/dL (ref 30.0–36.0)
MCV: 93.6 fL (ref 80.0–100.0)
Monocytes Absolute: 0.4 10*3/uL (ref 0.1–1.0)
Monocytes Relative: 6 %
Neutro Abs: 2.9 10*3/uL (ref 1.7–7.7)
Neutrophils Relative %: 47 %
Platelets: 171 10*3/uL (ref 150–400)
RBC: 4.7 MIL/uL (ref 4.22–5.81)
RDW: 11.4 % — ABNORMAL LOW (ref 11.5–15.5)
WBC: 6.1 10*3/uL (ref 4.0–10.5)
nRBC: 0 % (ref 0.0–0.2)

## 2021-06-11 LAB — BASIC METABOLIC PANEL
Anion gap: 7 (ref 5–15)
BUN: 13 mg/dL (ref 6–20)
CO2: 29 mmol/L (ref 22–32)
Calcium: 9.2 mg/dL (ref 8.9–10.3)
Chloride: 101 mmol/L (ref 98–111)
Creatinine, Ser: 0.83 mg/dL (ref 0.61–1.24)
GFR, Estimated: >60 mL/min (ref >60)
Glucose, Bld: 228 mg/dL — ABNORMAL HIGH (ref 70–99)
Potassium: 3.7 mmol/L (ref 3.5–5.1)
Sodium: 137 mmol/L (ref 135–145)

## 2021-06-11 LAB — URINALYSIS, ROUTINE W REFLEX MICROSCOPIC
Bilirubin Urine: NEGATIVE
Glucose, UA: >=500 mg/dL — AB
Hgb urine dipstick: NEGATIVE
Ketones, ur: NEGATIVE mg/dL
Nitrite: NEGATIVE
Protein, ur: 100 mg/dL — AB
Specific Gravity, Urine: 1.031 — ABNORMAL HIGH (ref 1.005–1.030)
pH: 5 (ref 5.0–8.0)

## 2021-06-11 LAB — CBG MONITORING, ED: Glucose-Capillary: 249 mg/dL — ABNORMAL HIGH (ref 70–99)

## 2021-06-11 MED ORDER — NYSTATIN-TRIAMCINOLONE 100000-0.1 UNIT/GM-% EX CREA: TOPICAL_CREAM | CUTANEOUS | 0 refills | Status: DC

## 2021-06-11 MED ORDER — NOVOLOG 70/30 FLEXPEN RELION (70-30) 100 UNIT/ML ~~LOC~~ SUPN: 12.0000 [IU] | PEN_INJECTOR | Freq: Two times a day (BID) | SUBCUTANEOUS | 3 refills | Status: AC

## 2021-06-11 MED ORDER — GLIPIZIDE 10 MG PO TABS: 10.0000 mg | ORAL_TABLET | Freq: Two times a day (BID) | ORAL | 0 refills | Status: DC

## 2021-06-11 NOTE — ED Triage Notes (Signed)
Pt reports blood sugars 250s recently  Does not have insurance so he has been out of his meds for almost 2 weeks.   Pt taking glipizide and an insulin pen for his diabetes.

## 2021-06-11 NOTE — ED Provider Notes (Signed)
[facility] EMERGENCY DEPARTMENT Provider Note   CSN: [id number] Arrival date & time: 06/11/21  1022     History Chief Complaint  Patient presents with   Hyperglycemia    Bobby Bates is a 55 y.o. male.  Insurance andPatient presents to ER chief complaint of high blood sugar.  He states has been on multiple medications but has not been able to see his doctor to fill them again.  Has been off his medications for the past 2 weeks.  He states at home blood sugars have been in the 200s.  Otherwise denies fevers or cough or vomiting or diarrhea.  Denies chest pain or abdominal pain.  He is noticed a recurrence of balanitis which he states has had in the past.      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 ([place]) 84/69/6295   Acute lower UTI 10/27/2020   HTN (hypertension) 10/27/2020   Dehydration 10/27/2020   Uncontrolled type 2 diabetes mellitus with hyperglycemia, without long-term current use of insulin ([place]) 10/27/2020    Past Surgical History:  Procedure Laterality Date   LEG SURGERY Right        Family History  Problem Relation Age of Onset   Kidney failure Father     Social History   Tobacco Use   Smoking status: Former    Packs/day: 0.50    Years: 10.00    Pack years: 5.00    Types: Cigarettes    Quit date: 02/04/2020    Years since quitting: 1.3   Smokeless tobacco: Never  Substance Use Topics   Alcohol use: Never   Drug use: Never    Home Medications Prior to Admission medications   Medication Sig Start Date End Date Taking? Authorizing Provider  nystatin-triamcinolone (MYCOLOG II) cream Apply to affected area daily 06/11/21  Yes Buchanan, Greggory Brandy, MD  atorvastatin (LIPITOR) 20 MG tablet Take 1 tablet (20 mg  total) by mouth daily. 09/28/20   Jaynee Eagles, PA-C  blood glucose meter kit and supplies KIT Dispense based on patient and insurance preference. Use up to four times daily as directed. (FOR ICD-9 250.00, 250.01). 10/28/20   Darliss Cheney, MD  glipiZIDE (GLUCOTROL) 10 MG tablet Take 1 tablet (10 mg total) by mouth 2 (two) times daily before a meal. 06/11/21 07/11/21  Luna Fuse, MD  insulin aspart (NOVOLOG) 100 UNIT/ML injection Inject 0-15 Units into the skin 3 (three) times daily with meals. CBG 70 - 120: 0 units CBG 121 - 150: 0 units CBG 151 - 200: 0 units CBG 201 - 250: 2 units CBG 251 - 300: 3 units CBG 301 - 350: 4 units CBG 351 - 400: 5 units 11/10/20   Kerin Perna, NP  insulin aspart protamine - aspart (NOVOLOG 70/30 FLEXPEN) (70-30) 100 UNIT/ML FlexPen Inject 12 Units into the skin 2 (two) times daily with a meal. 06/11/21   Luna Fuse, MD  insulin aspart protamine - aspart (NOVOLOG 70/30 MIX) (70-30) 100 UNIT/ML FlexPen INJECT 0.12 MLS (12 UNITS TOTAL) INTO THE SKIN 2 (TWO) TIMES DAILY WITH A MEAL. 11/10/20 11/10/21  Kerin Perna, NP  Insulin Pen Needle 32G X 4 MM MISC USE AS DIRECTED WITH INSULIN 10/28/20 10/28/21  Darliss Cheney, MD  lisinopril (ZESTRIL) 20 MG tablet Take 1 tablet (20 mg total) by mouth daily. Pt unsure of dosage strength 11/10/20   Kerin Perna, NP  lisinopril (ZESTRIL) 20 MG tablet TAKE 1 TABLET BY MOUTH DAILY. 11/10/20 11/10/21  Kerin Perna, NP  metFORMIN (GLUCOPHAGE) 1000 MG tablet Take 1 tablet (1,000 mg total) by mouth 2 (two) times daily with a meal. 09/28/20   Jaynee Eagles, PA-C  PENTIPS 32G X 4 MM MISC See admin instructions. with insulin 10/28/20   [provider]  insulin glargine (LANTUS) 100 UNIT/ML injection INJECT 15 UNITS TOTAL INTO THE SKIN AT BEDTIME. 10/28/20 10/28/20  Darliss Cheney, MD    Allergies    Other  Review of Systems   Review of Systems  Constitutional:  Negative for fever.  HENT:  Negative for ear pain and sore  throat.   Eyes:  Negative for pain.  Respiratory:  Negative for cough.   Cardiovascular:  Negative for chest pain.  Gastrointestinal:  Negative for abdominal pain.  Genitourinary:  Negative for flank pain.  Musculoskeletal:  Negative for back pain.  Skin:  Negative for color change and rash.  Neurological:  Negative for syncope.  All other systems reviewed and are negative.  Physical Exam Updated Vital Signs BP (!) 142/92 (BP Location: Right Arm)   Pulse 64   Temp 98 F (36.7 C) (Oral)   Resp 16   Ht 6' 2"  (1.88 m)   Wt 111.1 kg   SpO2 99%   BMI 31.46 kg/m   Physical Exam Constitutional:      Appearance: He is well-developed.  HENT:     Head: Normocephalic.     Nose: Nose normal.  Eyes:     Extraocular Movements: Extraocular movements intact.  Cardiovascular:     Rate and Rhythm: Normal rate.  Pulmonary:     Effort: Pulmonary effort is normal.  Genitourinary:    Comments: Some thickening erythema and irritation to foreskin of his penis.  Urethra appears patent. Skin:    Coloration: Skin is not jaundiced.  Neurological:     Mental Status: He is alert. Mental status is at baseline.    ED Results / Procedures / Treatments   Labs (all labs ordered are listed, but only abnormal results are displayed) Labs Reviewed  BASIC METABOLIC PANEL - Abnormal; Notable for the following components:      Result Value   Glucose, Bld 228 (*)    All other components within normal limits  CBC WITH DIFFERENTIAL/PLATELET - Abnormal; Notable for the following components:   RDW 11.4 (*)    All other components within normal limits  URINALYSIS, ROUTINE W REFLEX MICROSCOPIC - Abnormal; Notable for the following components:   APPearance CLOUDY (*)    Specific Gravity, Urine 1.031 (*)    Glucose, UA >=500 (*)    Protein, ur 100 (*)    Leukocytes,Ua LARGE (*)    Bacteria, UA RARE (*)    All other components within normal limits  CBG MONITORING, ED - Abnormal; Notable for the following  components:   Glucose-Capillary 249 (*)    All other components within normal limits  CBG MONITORING, ED    EKG None  Radiology No results found.  Procedures Procedures   Medications Ordered in ED Medications - No data to display  ED Course  I have reviewed the triage vital signs and the nursing notes.  Pertinent labs & imaging results that were available during my care of the patient were reviewed  by me and considered in my medical decision making (see chart for details).    MDM Rules/Calculators/A&P                           Labs show hyperglycemia but no evidence of DKA.  Patient without any symptoms at this time.  Given refill of his insulin and other medications.  Given ointment for his balanitis.  Will recommend outpatient follow-up in the clinic within the week.  Recommending immediate return if he is unable to control his blood sugars if he has fevers pain or any additional concerns.  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

## 2021-06-11 NOTE — Discharge Instructions (Signed)
Call your primary care doctor or specialist as discussed in the next 2-3 days.   Return immediately back to the ER if:  Your symptoms worsen within the next 12-24 hours. You develop new symptoms such as new fevers, persistent vomiting, new pain, shortness of breath, or new weakness or numbness, or if you have any other concerns.  

## 2021-06-11 NOTE — ED Provider Notes (Signed)
Emergency Medicine Provider Triage Evaluation Note  Bobby Bates , a 55 y.o. male  was evaluated in triage.  Pt complains of hyperglycemia.  Sugars have been in the 200s for the past week or so since he ran out of his insulin pen and glipizide.  He states that he is suffering from balanitis but denies any vomiting, fevers, chest pain or cough.  Does report polydipsia and polyuria.  Review of Systems  Positive: Polyuria, polydipsia Negative: Vomiting, fever  Physical Exam  BP (!) 147/105 (BP Location: Right Arm)   Pulse 73   Temp 98.3 F (36.8 C) (Oral)   Resp 16   Ht 6\' 2"  (1.88 m)   Wt 111.1 kg   SpO2 97%   BMI 31.46 kg/m  Gen:   Awake, no distress   Resp:  Normal effort  MSK:   Moves extremities without difficulty  Other:  Abdomen is soft  Medical Decision Making  Medically screening exam initiated at 11:08 AM.  Appropriate orders placed.  Bobby Bates was informed that the remainder of the evaluation will be completed by another provider, this initial triage assessment does not replace that evaluation, and the importance of remaining in the ED until their evaluation is complete.  CBG is 249 in triage will order additional labs and urine.  Consider TOC consult for possible medication assistance if he ends up being discharged.   , PA-C 06/11/21 1110    06/13/21, MD 06/11/21 2117

## 2022-11-08 IMAGING — DX DG LUMBAR SPINE COMPLETE 4+V
5 series · 5 of 5 positions shown · non-contrast
Comparison: None.

CLINICAL DATA: Low back pain after a fall/being knocked to the
ground 4 days ago.

EXAM:
LUMBAR SPINE - COMPLETE 4+ VIEW

[l-spine ap]
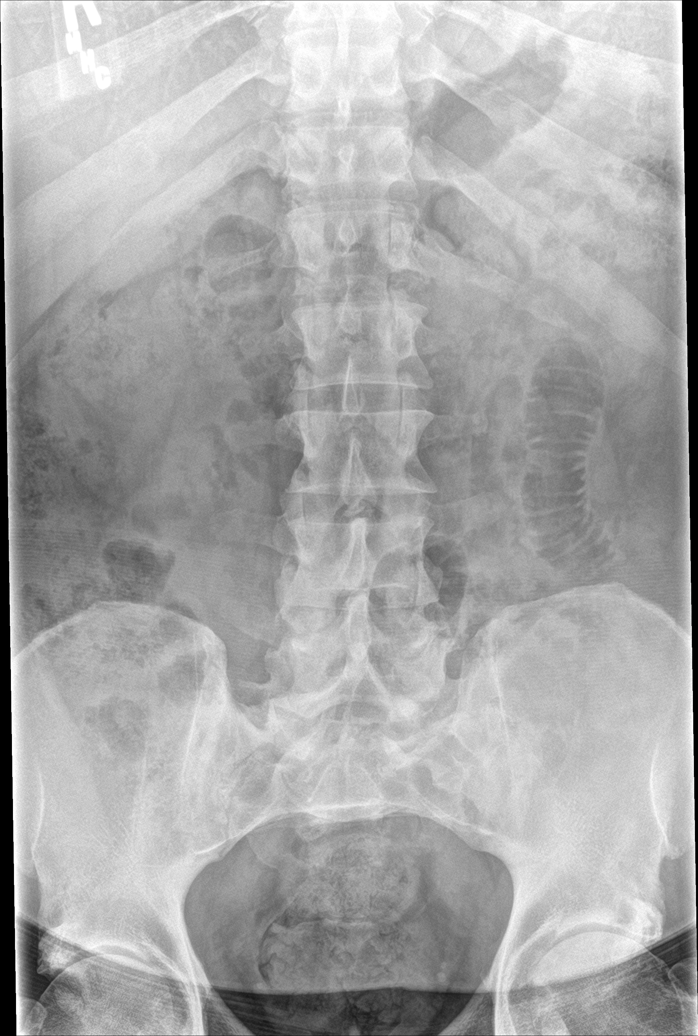

[l-spine obl (1 of 2)]
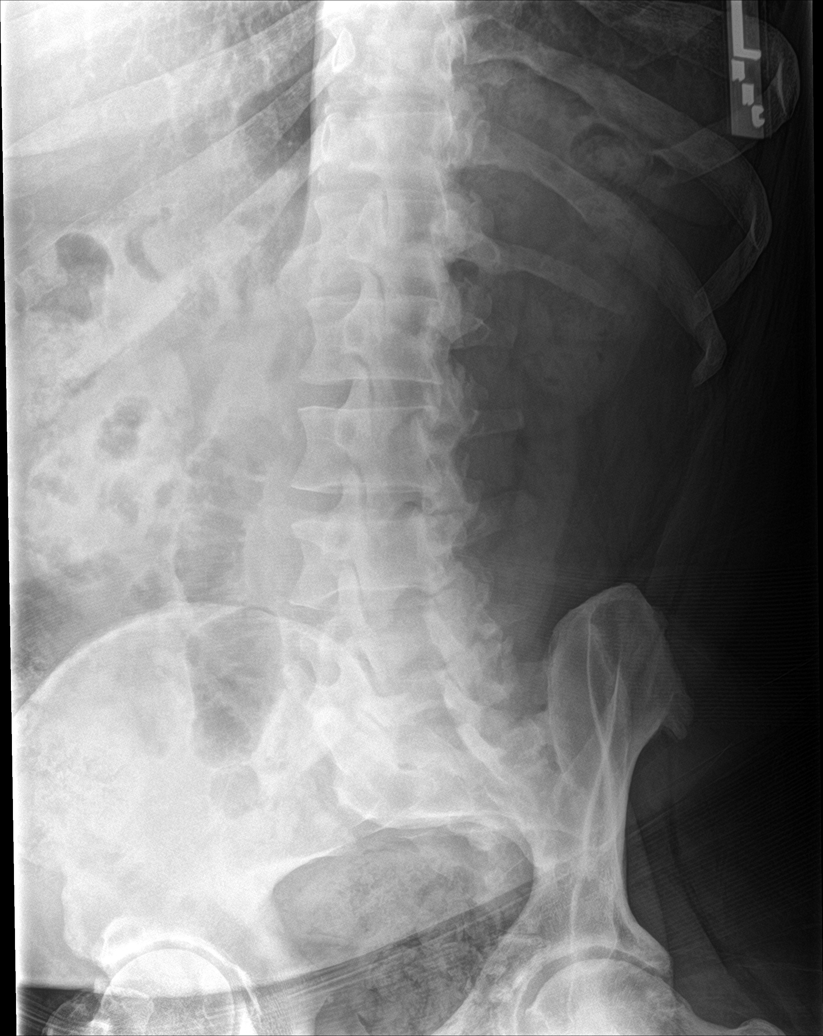

[l-spine obl (2 of 2)]
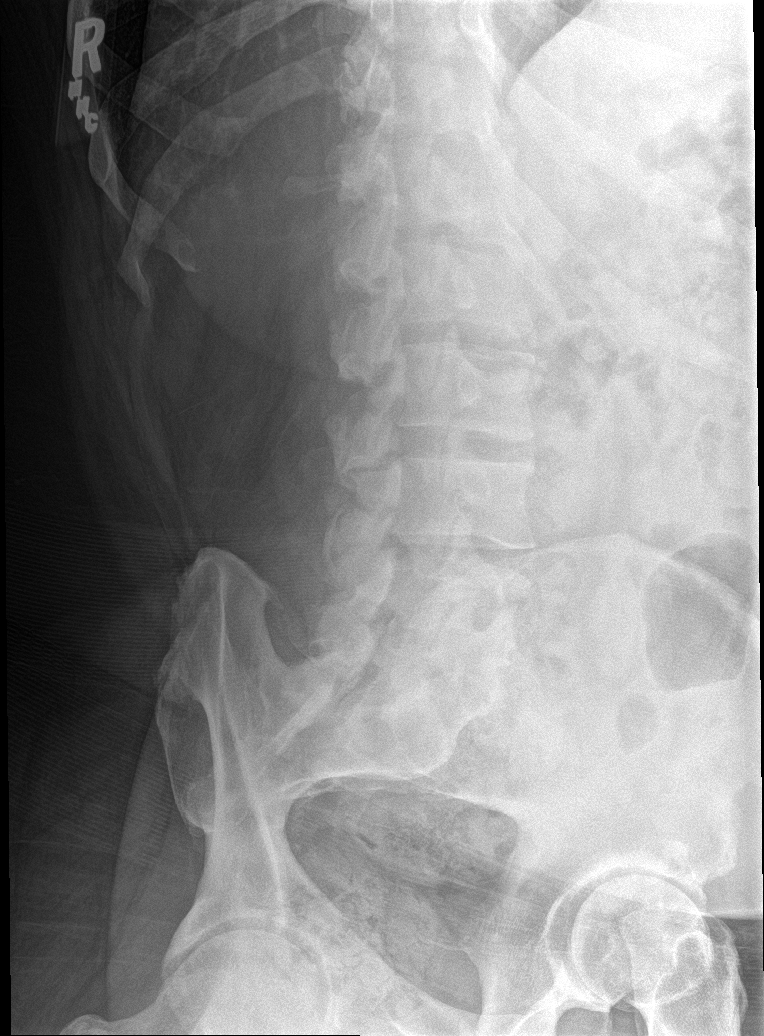

[l-spine lat]
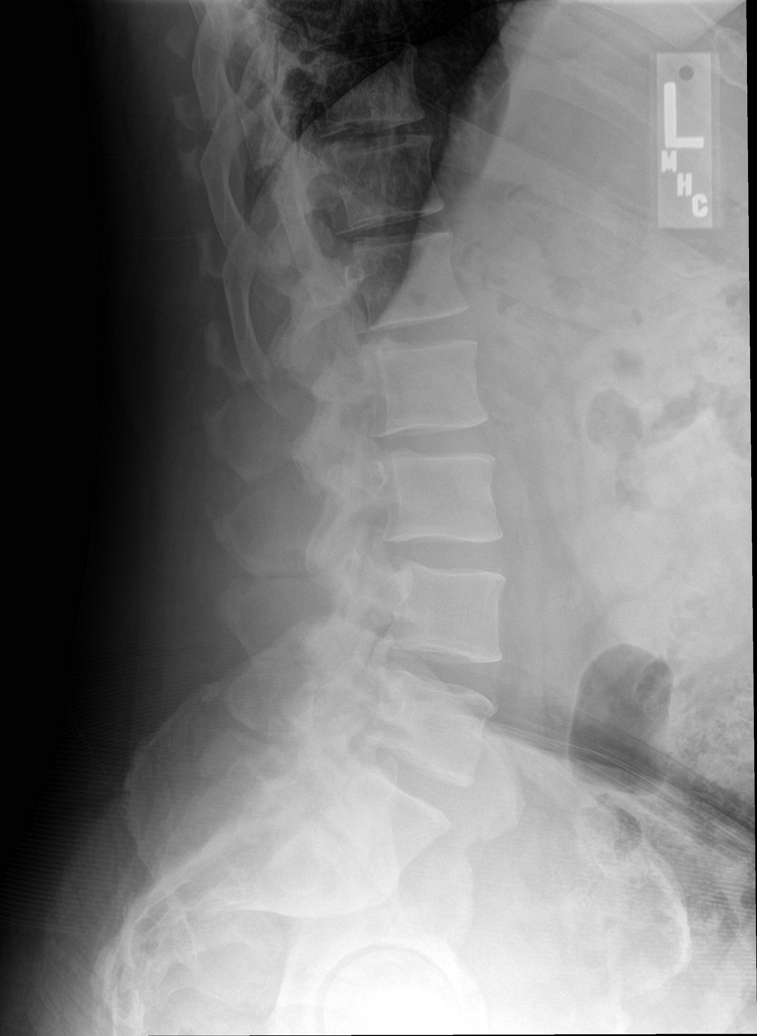

[l-spine spot]
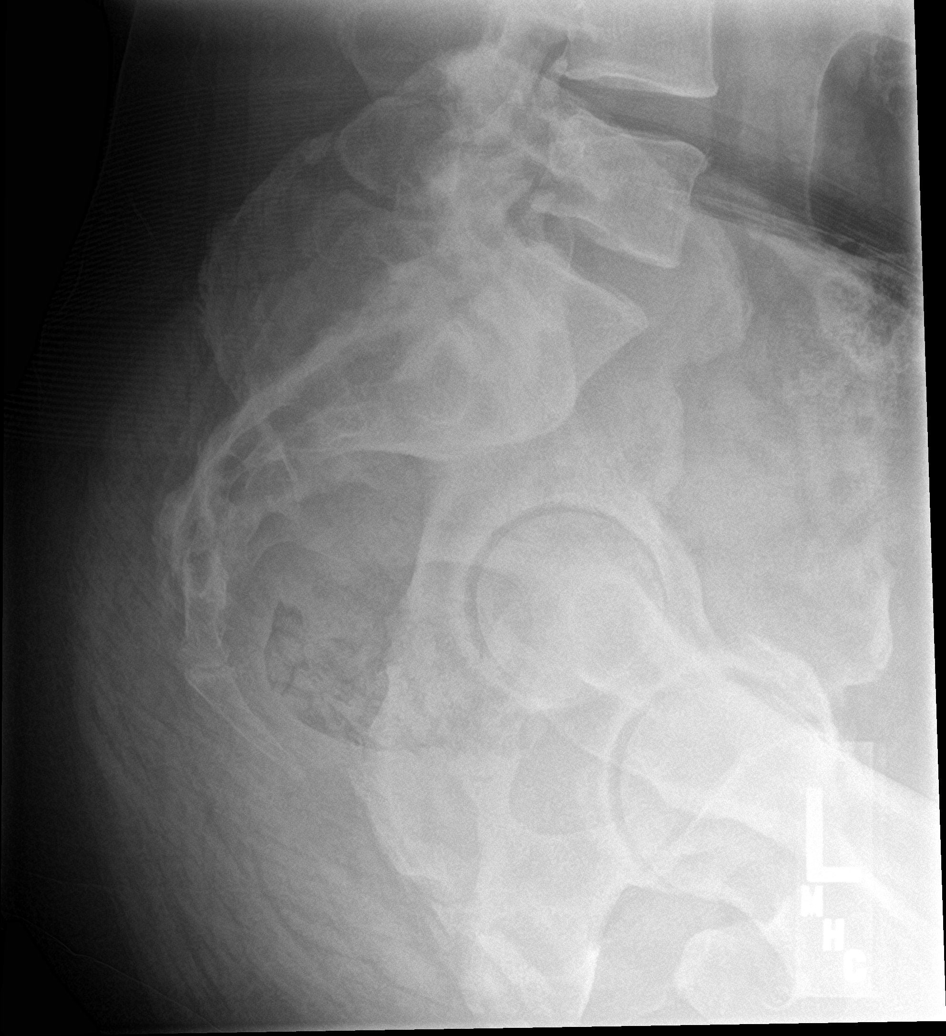

[5 of 5 positions shown; findings below may reference images not displayed]

FINDINGS: There is transitional lumbosacral anatomy without prior spine
imaging available for comparison. For the purposes of this
dictation, the transitional segment will be considered a partially
lumbarized S1 with rudimentary S1-2 disc. There are rudimentary ribs
at L1.

Vertebral alignment is normal. No fracture is identified.
Intervertebral disc space heights are preserved. There is at most
mild lower lumbar facet arthrosis. A moderate amount of stool is
noted in the colon and rectum.
IMPRESSION: No evidence of acute osseous abnormality.

## 2022-11-18 ENCOUNTER — Inpatient Hospital Stay (HOSPITAL_COMMUNITY)
Admission: RE | Admit: 2022-11-18 | Discharge: 2022-11-18 | Disposition: A | Discharge status: Home / Self Care | Payer: Self-pay | Source: Ambulatory Visit | Attending: Family Medicine | Admitting: Family Medicine

## 2022-11-18 ENCOUNTER — Encounter (HOSPITAL_COMMUNITY): Payer: Self-pay

## 2022-11-18 ENCOUNTER — Ambulatory Visit (HOSPITAL_COMMUNITY)
Admission: EM | Admit: 2022-11-18 | Discharge: 2022-11-18 | Disposition: A | Discharge status: Home / Self Care | Payer: Commercial Managed Care - HMO | Attending: Family Medicine | Admitting: Family Medicine

## 2022-11-18 DIAGNOSIS — E1165 Type 2 diabetes mellitus with hyperglycemia: Secondary | ICD-10-CM

## 2022-11-18 DIAGNOSIS — Z794 Long term (current) use of insulin: Secondary | ICD-10-CM

## 2022-11-18 DIAGNOSIS — E11628 Type 2 diabetes mellitus with other skin complications: Secondary | ICD-10-CM

## 2022-11-18 DIAGNOSIS — N481 Balanitis: Secondary | ICD-10-CM

## 2022-11-18 LAB — CBG MONITORING, ED: Glucose-Capillary: 252 mg/dL — ABNORMAL HIGH (ref 70–99)

## 2022-11-18 LAB — POCT URINALYSIS DIPSTICK, ED / UC
Glucose, UA: >=1000 mg/dL — AB
Ketones, ur: 15 mg/dL — AB
Leukocytes,Ua: NEGATIVE
Nitrite: NEGATIVE
Protein, ur: 100 mg/dL — AB
Specific Gravity, Urine: 1.025 (ref 1.005–1.030)
Urobilinogen, UA: 2 mg/dL — ABNORMAL HIGH (ref 0.0–1.0)
pH: 5.5 (ref 5.0–8.0)

## 2022-11-18 MED ORDER — METFORMIN HCL 1000 MG PO TABS: 1000.0000 mg | ORAL_TABLET | Freq: Two times a day (BID) | ORAL | 0 refills | Status: AC

## 2022-11-18 MED ORDER — NYSTATIN-TRIAMCINOLONE 100000-0.1 UNIT/GM-% EX CREA: TOPICAL_CREAM | CUTANEOUS | 0 refills | Status: AC

## 2022-11-18 NOTE — Discharge Instructions (Signed)
You were seen today for elevated blood sugar.  I have refilled the metformin for you today.   I have sent out a cream for the rash as well.  Please follow up if not improving or worsening.

## 2022-11-18 NOTE — ED Triage Notes (Signed)
Chief Complaint: Patient states blood sugar is elevated and now starting to get a yeast infection. Has been without meds for some time.   Onset: 2 weeks   Prescriptions or OTC medications tried: Yes- Monistat     with no relief

## 2022-11-18 NOTE — ED Notes (Signed)
Patient states has not ate anything since last night.

## 2022-11-18 NOTE — ED Provider Notes (Signed)
[facility]    CSN: [id number] Arrival date & time: 11/18/22  1211      History   Chief Complaint Chief Complaint  Patient presents with   Appointment   Blood Sugar Problem    HPI Harrell Kwiecinski is a 57 y.o. male.   Patient is here for elevated BS.  He does have a h/o diabetes, and has been out of meds for "a long time".  He thinks about 4 months.  His insurance will not cover meds.  He has had increase thirst, increased urination.  Some blurry vision.  No dizziness or light headeness.  From [facility] he is able to get insulin 70/30, as well as metformin.  He was on a SSI.   He is having a rash on the penis.  It is dry, itchy.  Has had this in the past with elevated sugars.        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 Authorizing Provider  insulin aspart protamine - aspart (NOVOLOG 70/30 FLEXPEN) (70-30) 100 UNIT/ML FlexPen Inject 12 Units into the skin 2 (two) times daily with a meal. 06/11/21  Yes Hong, Greggory Brandy, MD  atorvastatin (LIPITOR) 20 MG tablet Take 1 tablet (20 mg total) by mouth daily. 09/28/20   Jaynee Eagles, PA-C  blood glucose meter kit and supplies KIT Dispense based on patient and insurance preference. Use up to four times daily as directed. (FOR ICD-9 250.00, 250.01). 10/28/20   Darliss Cheney, MD  glipiZIDE  (GLUCOTROL) 10 MG tablet Take 1 tablet (10 mg total) by mouth 2 (two) times daily before a meal. 06/11/21 07/11/21  Luna Fuse, MD  insulin aspart (NOVOLOG) 100 UNIT/ML injection Inject 0-15 Units into the skin 3 (three) times daily with meals. CBG 70 - 120: 0 units CBG 121 - 150: 0 units CBG 151 - 200: 0 units CBG 201 - 250: 2 units CBG 251 - 300: 3 units CBG 301 - 350: 4 units CBG 351 - 400: 5 units 11/10/20   Kerin Perna, NP  insulin aspart protamine - aspart (NOVOLOG 70/30 MIX) (70-30) 100 UNIT/ML FlexPen INJECT 0.12 MLS (12 UNITS TOTAL) INTO THE SKIN 2 (TWO) TIMES DAILY WITH A MEAL. 11/10/20 11/10/21  Kerin Perna, NP  lisinopril (ZESTRIL) 20 MG tablet Take 1 tablet (20 mg total) by mouth daily. Pt unsure of dosage strength 11/10/20   Kerin Perna, NP  lisinopril (ZESTRIL) 20 MG tablet TAKE 1 TABLET BY MOUTH DAILY. 11/10/20 11/10/21  Kerin Perna, NP  metFORMIN (GLUCOPHAGE) 1000 MG tablet Take 1 tablet (1,000 mg total) by mouth 2 (two) times daily with a meal. 09/28/20   Jaynee Eagles, PA-C  nystatin-triamcinolone [facility] II) cream Apply to affected area daily 06/11/21   Luna Fuse, MD  PENTIPS 32G X 4 MM MISC  See admin instructions. with insulin 10/28/20   [provider]  insulin glargine (LANTUS) 100 UNIT/ML injection INJECT 15 UNITS TOTAL INTO THE SKIN AT BEDTIME. 10/28/20 10/28/20  Darliss Cheney, MD    Family History Family History  Problem Relation Age of Onset   Kidney failure Father     Social History Social History   Tobacco Use   Smoking status: Some Days    Packs/day: 0.50    Years: 10.00    Total pack years: 5.00    Types: Cigarettes    Last attempt to quit: 02/04/2020    Years since quitting: 2.7   Smokeless tobacco: Never  Vaping Use   Vaping Use: Never used  Substance Use Topics   Alcohol use: Never   Drug use: Never     Allergies   Other   Review of Systems Review of Systems  Constitutional: Negative.   HENT: Negative.     Respiratory: Negative.    Cardiovascular: Negative.   Gastrointestinal: Negative.   Genitourinary: Negative.   Musculoskeletal: Negative.   Skin:  Positive for rash.     Physical Exam Triage Vital Signs ED Triage Vitals  Enc Vitals Group     BP 11/18/22 1323 117/82     Pulse Rate 11/18/22 1323 69     Resp 11/18/22 1323 16     Temp 11/18/22 1323 98 F (36.7 C)     Temp Source 11/18/22 1323 Oral     SpO2 11/18/22 1323 94 %     Weight 11/18/22 1322 237 lb (107.5 kg)     Height 11/18/22 1322 '6\' 2"'$  (1.88 m)     Head Circumference --      Peak Flow --      Pain Score 11/18/22 1320 8     Pain Loc --      Pain Edu? --      Excl. in [place]? --    No data found.  Updated Vital Signs BP 117/82 (BP Location: Left Arm)   Pulse 69   Temp 98 F (36.7 C) (Oral)   Resp 16   Ht '6\' 2"'$  (1.88 m)   Wt 107.5 kg   SpO2 94%   BMI 30.43 kg/m   Visual Acuity Right Eye Distance:   Left Eye Distance:   Bilateral Distance:    Right Eye Near:   Left Eye Near:    Bilateral Near:     Physical Exam Constitutional:      Appearance: Normal appearance.  Cardiovascular:     Rate and Rhythm: Normal rate and regular rhythm.  Pulmonary:     Effort: Pulmonary effort is normal.     Breath sounds: Normal breath sounds.  Abdominal:     Palpations: Abdomen is soft.     Tenderness: There is no abdominal tenderness. There is no guarding or rebound.  Genitourinary:    Comments: deferred Neurological:     Mental Status: He is alert.      UC Treatments / Results  Labs (all labs ordered are listed, but only abnormal results are displayed) Labs Reviewed  CBG MONITORING, ED - Abnormal; Notable for the following components:      Result Value   Glucose-Capillary 252 (*)    All other components within normal limits    EKG   Radiology No results found.  Procedures Procedures (including critical care time)  Medications Ordered in UC Medications - No data to display  Initial  Impression / Assessment and Plan / UC Course  I have reviewed the triage vital signs and the nursing notes.  Pertinent labs & imaging results that were available during my care of the patient were reviewed by me and considered in my medical decision making (see chart for details).     Patient seen today for elevated BS with known dx of diabetes, unable to afford medications.  He has been out x 4 months.  He is here for refill of medication as he has been having some blurry vision and rash on his penis.   He has a h/o balantis in the past with elevated BS and this is similar.   His BS is elevated, but without other concerning signs/symptoms for DKA.  Will refill metformin today and treat for balantis.   Please follow up if not improving.   Final Clinical Impressions(s) / UC Diagnoses   Final diagnoses:  Type 2 diabetes mellitus with hyperglycemia, with long-term current use of insulin ([place])  Balanitis     Discharge Instructions      You were seen today for elevated blood sugar.  I have refilled the metformin for you today.   I have sent out a cream for the rash as well.  Please follow up if not improving or worsening.      ED Prescriptions     Medication Sig Dispense Auth. Provider   metFORMIN (GLUCOPHAGE) 1000 MG tablet Take 1 tablet (1,000 mg total) by mouth 2 (two) times daily with a meal. 180 tablet Garold Sheeler, MD   nystatin-triamcinolone (MYCOLOG II) cream Apply to affected area daily 15 g Rondel Oh, MD      PDMP not reviewed this encounter.   Rondel Oh, MD 11/18/22 1351
